# Patient Record
Sex: Male | Born: 1975 | Race: White | Hispanic: No | Marital: Single | Smoking: Current every day smoker
Health system: Southern US, Community
[De-identification: ages and names within clinical notes are randomized; demographics above are authoritative.]

## PROBLEM LIST (undated history)

## (undated) DIAGNOSIS — L03319 Cellulitis of trunk, unspecified: Secondary | ICD-10-CM

## (undated) DIAGNOSIS — L02219 Cutaneous abscess of trunk, unspecified: Secondary | ICD-10-CM

## (undated) DIAGNOSIS — Z8701 Personal history of pneumonia (recurrent): Secondary | ICD-10-CM

## (undated) HISTORY — DX: Personal history of pneumonia (recurrent): Z87.01

## (undated) HISTORY — DX: Cutaneous abscess of trunk, unspecified: L02.219

## (undated) HISTORY — PX: CYST EXCISION: SHX5701

## (undated) HISTORY — DX: Cellulitis of trunk, unspecified: L03.319

---

## 2005-09-11 ENCOUNTER — Emergency Department (HOSPITAL_COMMUNITY): Admission: EM | Admit: 2005-09-11 | Discharge: 2005-09-11 | Payer: Self-pay | Admitting: Family Medicine

## 2011-12-27 ENCOUNTER — Ambulatory Visit (INDEPENDENT_AMBULATORY_CARE_PROVIDER_SITE_OTHER): Payer: 59 | Admitting: Family Medicine

## 2011-12-27 VITALS — BP 129/80 | HR 72 | Temp 97.5°F | Resp 18 | Ht 66.5 in | Wt 196.2 lb

## 2011-12-27 DIAGNOSIS — L259 Unspecified contact dermatitis, unspecified cause: Secondary | ICD-10-CM

## 2011-12-27 MED ORDER — TRIAMCINOLONE ACETONIDE 0.1 % EX CREA: TOPICAL_CREAM | Freq: Two times a day (BID) | CUTANEOUS | Status: DC

## 2011-12-27 MED ORDER — METHYLPREDNISOLONE ACETATE 80 MG/ML IJ SUSP
80.0000 mg | Freq: Once | INTRAMUSCULAR | Status: AC
  Administered 2011-12-27: 80 mg via INTRAMUSCULAR

## 2011-12-27 NOTE — Patient Instructions (Signed)

## 2011-12-27 NOTE — Progress Notes (Signed)
Subjective: Patient was working outdoors or Friday. Since then he started breaking out with a rash in his upper arms, mostly above the elbows and below the sleeve line on the medial aspect of the arm. It is red and itchy. He has never been allergic to poison ivy that he knows of, for use to be able to pull it with his hands. No other known allergens. He works setting up dog shows.  Objective: Erythematous macular rash on his upper arms with some linear excoriations.  Assessment: Contact dermatitis  Plan: Solu-Medrol 80 a.m. Triamcinolone cream twice daily Return if worse.

## 2014-03-18 ENCOUNTER — Ambulatory Visit (INDEPENDENT_AMBULATORY_CARE_PROVIDER_SITE_OTHER): Payer: 59 | Admitting: Emergency Medicine

## 2014-03-18 VITALS — BP 122/82 | HR 75 | Temp 98.1°F | Resp 16 | Ht 66.25 in | Wt 199.8 lb

## 2014-03-18 DIAGNOSIS — L259 Unspecified contact dermatitis, unspecified cause: Secondary | ICD-10-CM

## 2014-03-18 MED ORDER — PREDNISONE 10 MG PO TABS
ORAL_TABLET | ORAL | Status: DC
Start: 2014-03-18 — End: 2014-07-05

## 2014-03-18 NOTE — Progress Notes (Signed)
Subjective:  This chart was scribed for Lesle Chris, MD by Carl Best, Medical Scribe. This patient was seen in Room 11 and the patient's care was started at 1:35 PM.   Patient ID: Joshua Leach, male    DOB: [birth year], 38 y.o.   MRN: [id number]  HPI HPI Comments: Joshua Leach is a 38 y.o. male who presents to the [facility] complaining of an itchy rash on his forearms and lower extremities bilaterally that started 3 days ago while he was trimming edges.  He states that he used rubbing alcohol, Claritin 10, and hand sanitizer with mild relief to his symptoms.  The patient denies having any allergies to medication.  History reviewed. No pertinent past medical history. History reviewed. No pertinent past surgical history. History reviewed. No pertinent family history. History   Social History  . Marital Status: Single    Spouse Name: N/A    Number of Children: N/A  . Years of Education: N/A   Occupational History  . Not on file.   Social History Main Topics  . Smoking status: Current Every Day Smoker -- 1.00 packs/day  . Smokeless tobacco: Not on file  . Alcohol Use: Yes     Comment: 1 case  . Drug Use: Not on file  . Sexual Activity: Not on file   Other Topics Concern  . Not on file   Social History Narrative  . No narrative on file   No Known Allergies   Review of Systems  Skin: Positive for rash. Negative for color change, pallor and wound.  All other systems reviewed and are negative.    Objective:  Physical Exam  Nursing note and vitals reviewed. Constitutional: He is oriented to person, place, and time. He appears well-developed and well-nourished.  HENT:  Head: Normocephalic and atraumatic.  Eyes: EOM are normal.  Neck: Normal range of motion.  Cardiovascular: Normal rate.   Pulmonary/Chest: Effort normal.  Musculoskeletal: Normal range of motion.  Neurological: He is alert and oriented to person, place, and time.    Skin: Skin is warm and dry. Rash noted.  Maculopapular rash involving both forearms and both lower extremities.    Psychiatric: He has a normal mood and affect. His behavior is normal.     BP 122/82  Pulse 75  Temp(Src) 98.1 F (36.7 C) (Oral)  Resp 16  Ht 5' 6.25" (1.683 m)  Wt 199 lb 12.8 oz (90.629 kg)  BMI 32.00 kg/m2  SpO2 99% Assessment & Plan:  Will treat with prednisone for 6 days. Claritin one a day Benadryl at night.  I personally performed the services described in this documentation, which was scribed in my presence. The recorded information has been reviewed and is accurate.

## 2014-03-18 NOTE — Patient Instructions (Signed)
       Poison Ivy Poison ivy is a inflammation of the skin (contact dermatitis) caused by touching the allergens on the leaves of the ivy plant following previous exposure to the plant. The rash usually appears 48 hours after exposure. The rash is usually bumps (papules) or blisters (vesicles) in a linear pattern. Depending on your own sensitivity, the rash may simply cause redness and itching, or it may also progress to blisters which may break open. These must be well cared for to prevent secondary bacterial (germ) infection, followed by scarring. Keep any open areas dry, clean, dressed, and covered with an antibacterial ointment if needed. The eyes may also get puffy. The puffiness is worst in the morning and gets better as the day progresses. This dermatitis usually heals without scarring, within 2 to 3 weeks without treatment. HOME CARE INSTRUCTIONS  Thoroughly wash with soap and water as soon as you have been exposed to poison ivy. You have about one half hour to remove the plant resin before it will cause the rash. This washing will destroy the oil or antigen on the skin that is causing, or will cause, the rash. Be sure to wash under your fingernails as any plant resin there will continue to spread the rash. Do not rub skin vigorously when washing affected area. Poison ivy cannot spread if no oil from the plant remains on your body. A rash that has progressed to weeping sores will not spread the rash unless you have not washed thoroughly. It is also important to wash any clothes you have been wearing as these may carry active allergens. The rash will return if you wear the unwashed clothing, even several days later. Avoidance of the plant in the future is the best measure. Poison ivy plant can be recognized by the number of leaves. Generally, poison ivy has three leaves with flowering branches on a single stem. Diphenhydramine may be purchased over the counter and used as needed for itching. Do not  drive with this medication if it makes you drowsy.Ask your caregiver about medication for children. SEEK MEDICAL CARE IF:  Open sores develop.  Redness spreads beyond area of rash.  You notice purulent (pus-like) discharge.  You have increased pain.  Other signs of infection develop (such as fever). Document Released: 08/13/2000 Document Revised: 11/08/2011 Document Reviewed: 07/02/2009 [employer] Patient Information 2015 [employer]. This information is not intended to replace advice given to you by your health care provider. Make sure you discuss any questions you have with your health care provider.  

## 2014-06-25 ENCOUNTER — Ambulatory Visit (INDEPENDENT_AMBULATORY_CARE_PROVIDER_SITE_OTHER): Payer: 59

## 2014-06-25 ENCOUNTER — Ambulatory Visit (INDEPENDENT_AMBULATORY_CARE_PROVIDER_SITE_OTHER): Payer: 59 | Admitting: Internal Medicine

## 2014-06-25 VITALS — BP 120/80 | HR 85 | Temp 97.9°F | Resp 18 | Ht 68.0 in | Wt 208.0 lb

## 2014-06-25 DIAGNOSIS — L259 Unspecified contact dermatitis, unspecified cause: Secondary | ICD-10-CM

## 2014-06-25 DIAGNOSIS — R062 Wheezing: Secondary | ICD-10-CM

## 2014-06-25 DIAGNOSIS — R05 Cough: Secondary | ICD-10-CM

## 2014-06-25 DIAGNOSIS — R0981 Nasal congestion: Secondary | ICD-10-CM

## 2014-06-25 DIAGNOSIS — R059 Cough, unspecified: Secondary | ICD-10-CM

## 2014-06-25 DIAGNOSIS — J159 Unspecified bacterial pneumonia: Secondary | ICD-10-CM

## 2014-06-25 LAB — POCT CBC
GRANULOCYTE PERCENT: 76.5 % (ref 37–80)
HEMATOCRIT: 50.4 % (ref 43.5–53.7)
HEMOGLOBIN: 16.7 g/dL (ref 14.1–18.1)
Lymph, poc: 2.7 (ref 0.6–3.4)
MCH, POC: 32.3 pg — AB (ref 27–31.2)
MCHC: 33 g/dL (ref 31.8–35.4)
MCV: 97.8 fL — AB (ref 80–97)
MID (cbc): 0.9 (ref 0–0.9)
MPV: 7.4 fL (ref 0–99.8)
POC GRANULOCYTE: 11.6 — AB (ref 2–6.9)
POC LYMPH PERCENT: 17.8 %L (ref 10–50)
POC MID %: 5.7 %M (ref 0–12)
Platelet Count, POC: 245 10*3/uL (ref 142–424)
RBC: 5.16 M/uL (ref 4.69–6.13)
RDW, POC: 13.2 %
WBC: 15.2 10*3/uL — AB (ref 4.6–10.2)

## 2014-06-25 MED ORDER — ALBUTEROL SULFATE HFA 108 (90 BASE) MCG/ACT IN AERS: 2.0000 | INHALATION_SPRAY | RESPIRATORY_TRACT | Status: DC | PRN

## 2014-06-25 MED ORDER — ALBUTEROL SULFATE (2.5 MG/3ML) 0.083% IN NEBU
2.5000 mg | INHALATION_SOLUTION | Freq: Once | RESPIRATORY_TRACT | Status: AC
  Administered 2014-06-25: 2.5 mg via RESPIRATORY_TRACT

## 2014-06-25 MED ORDER — METHYLPREDNISOLONE ACETATE 80 MG/ML IJ SUSP
80.0000 mg | Freq: Once | INTRAMUSCULAR | Status: AC
  Administered 2014-06-25: 80 mg via INTRAMUSCULAR

## 2014-06-25 MED ORDER — AZITHROMYCIN 250 MG PO TABS: ORAL_TABLET | ORAL | Status: DC

## 2014-06-25 MED ORDER — HYDROCOD POLST-CHLORPHEN POLST 10-8 MG/5ML PO LQCR: 5.0000 mL | Freq: Two times a day (BID) | ORAL | Status: DC | PRN

## 2014-06-25 MED ORDER — BENZONATATE 100 MG PO CAPS: 100.0000 mg | ORAL_CAPSULE | Freq: Three times a day (TID) | ORAL | Status: DC | PRN

## 2014-06-25 MED ORDER — IPRATROPIUM BROMIDE 0.02 % IN SOLN
0.5000 mg | Freq: Once | RESPIRATORY_TRACT | Status: AC
  Administered 2014-06-25: 0.5 mg via RESPIRATORY_TRACT

## 2014-06-25 MED ORDER — TRIAMCINOLONE ACETONIDE 0.1 % EX CREA: TOPICAL_CREAM | Freq: Two times a day (BID) | CUTANEOUS | Status: AC

## 2014-06-25 NOTE — Progress Notes (Signed)
Subjective:    Patient ID: Joshua Leach, male    DOB: 09/12/1975, 38 y.o.   MRN: [id number]  Cough Associated symptoms include chills, a fever, headaches and a rash. Pertinent negatives include no sore throat or shortness of breath.  Rash Associated symptoms include congestion, coughing, diarrhea (1 day last week that resolved after 1 episode), fatigue and a fever. Pertinent negatives include no shortness of breath or sore throat.    38 year old male 1pk/day smoker with allergies to dog dander, here today with chief complaint of cough, rash, and nasal congestion.  He states this began 1 month ago with coughing that progressively worsened to intractable coughing into dry heaving.  The productive phlegm started as green, but has now turned into a white thick mucus.  This coughing was associated with a headache, congestion, and ear fullness.  Their is no ear pain, but sounds like a bad speaker with some vibratory sounds to their speech.  He takes naproxen for the headache which helps.  But he has tried Nyquil, Dayquil, and Tussin DM to no avail.  He had associated chills and waking up in a cold sweat.  [facility], due to his busy travelling work week.  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  He also has 4 dogs at home, despite his allergies.  He states the his allergies are much better when he is staying in the hotels.    Sleep: Beer 6 cans/night to sleep.  He sleeps about 4 hrs each night.  Has coffee in the morning.  Has racing thoughts in the evening.  Smoking 1pk/day if not busy.  No illicit drug use.    Rash: Itchy rash for 1 month at his waist line.  It is pruritic.  He notices that it goes away when he does not use his belt after a while and rash will scale.      Review of Systems  Constitutional: Positive for fever, chills, diaphoresis and fatigue.  HENT: Positive for congestion and hearing loss. Negative for facial swelling, sinus pressure, sneezing and  sore throat.   Respiratory: Positive for cough. Negative for apnea, chest tightness and shortness of breath.   Gastrointestinal: Positive for diarrhea (1 day last week that resolved after 1 episode). Negative for constipation.  Skin: Positive for rash.  Neurological: Positive for dizziness (secondary to intractable coughing) and headaches.       Objective:   Physical Exam  Constitutional: He is oriented to person, place, and time. He appears well-developed and well-nourished. No distress.  BP 120/80  Pulse 85  Temp(Src) 97.9 F (36.6 C) (Oral)  Resp 18  Ht 5\' 8"  (1.727 m)  Wt 208 lb (94.348 kg)  BMI 31.63 kg/m2  SpO2 98%  HENT:  Right Ear: No tenderness. Tympanic membrane is erythematous (Mild erythema around TM.).  Left Ear: No tenderness. Tympanic membrane is not erythematous.  Nose: Mucosal edema and rhinorrhea (Mild rhinorrhea) present. Right sinus exhibits no maxillary sinus tenderness and no frontal sinus tenderness. Left sinus exhibits no maxillary sinus tenderness and no frontal sinus tenderness.  Mouth/Throat: Oropharynx is clear and moist. Mucous membranes are not pale and not cyanotic. No posterior oropharyngeal edema, posterior oropharyngeal erythema or tonsillar abscesses.  Eyes: Right eye exhibits no discharge. Left eye exhibits no discharge. Right pupil is round and reactive. Left pupil is round. Pupils are equal.  Cardiovascular: Normal rate, regular rhythm and normal heart sounds.   Pulmonary/Chest: Effort  normal. No apnea, not tachypneic and not bradypneic. No respiratory distress. He has no decreased breath sounds. He has wheezes (Left lower lobe is has wheezing after the end of forced expiratory. ) in the left middle field and the left lower field.  Lymphadenopathy:    He has cervical adenopathy (Left anterior cervical adenopathy).  Neurological: He is alert and oriented to person, place, and time.  Skin: Rash (3 inches from umbilicus is erythematous patch slightly  rounded with large scaling) noted.  Psychiatric: He has a normal mood and affect. His speech is normal and behavior is normal. Judgment and thought content normal.    Results for orders placed in visit on 06/25/14  POCT CBC      Result Value Ref Range   WBC 15.2 (*) 4.6 - 10.2 K/uL   Lymph, poc 2.7  0.6 - 3.4   POC LYMPH PERCENT 17.8  10 - 50 %L   MID (cbc) 0.9  0 - 0.9   POC MID % 5.7  0 - 12 %M   POC Granulocyte 11.6 (*) 2 - 6.9   Granulocyte percent 76.5  37 - 80 %G   RBC 5.16  4.69 - 6.13 M/uL   Hemoglobin 16.7  14.1 - 18.1 g/dL   HCT, POC 78.250.4  95.643.5 - 53.7 %   MCV 97.8 (*) 80 - 97 fL   MCH, POC 32.3 (*) 27 - 31.2 pg   MCHC 33.0  31.8 - 35.4 g/dL   RDW, POC 21.313.2     Platelet Count, POC 245  142 - 424 K/uL   MPV 7.4  0 - 99.8 fL   Dr. Harriette OharaMcCaulin Peak Flow Prediction 634 (per UptoDate Calculator.) Pt. Peak Flow pre-nebulizing tx.590 Pt. Peak Flow post-nebulizing tx 527  [facility] reading (PRIMARY) by  Dr. Perrin MalteseGuest: Right lobe infiltrates.     Assessment & Plan:  Pneumonia, bacterial  Infiltrates, increase in WBC, paired with physical exam show a respiratory illness with a most likely cause of pneumonia.  He is dealing with both irritant and allergic materials by his tobacco use and living with his dogs.  He has hx of upper respiratory infection, most likely from inflammation of these contacts that cause him to not have the ability to clear out substances like bacterial and viral inoculation.  He does not notice the wheezing, however it is present, and as a smoker who does not avoid anything that may disrupt respiratory passage and clearance, I am doubtful that he would know if he was dyspneic unless severe.    -CBC, albuterol (PROVENTIL) (2.5 MG/3ML) 0.083% nebulizer solution 2.5 mg, ipratropium (ATROVENT) nebulizer solution 0.5 mg--The nebulizing treatment loosened up the mucus and the wheezing became more prominent and identified.     -methylPREDNISolone acetate (DEPO-MEDROL)  injection 80 mg  -Encouraged patient's smoking cessation both verbally and with paper handout.    -benzonatate (TESSALON) 100 MG capsule 1-2 pearls TID  - chlorpheniramine-HYDROcodone (TUSSIONEX PENNKINETIC ER) 10-8 MG/5ML LQCR  -albuterol (PROVENTIL HFA;VENTOLIN HFA) 108 (90 BASE) MCG/ACT inhaler-PRN with wheezing.   -RTC in 4 days to see if lungs are clearer.      -Contact PCP doctor, Dr. Harriette [facility]McCaulin or [facility] for smoking cessation facilitation and sleep problems.    Contact dermatitis  triamcinolone cream (KENALOG) 0.1 % -Discussed avoiding metals to skin ie nickel  Trena PlattStephanie English, PA-C [facility] 10/27/201512:49 PM

## 2014-06-25 NOTE — Patient Instructions (Addendum)
Please take the azithromycin for 5 days. Please return to the clinic on Saturday for a check up. Tussionex for nighttime coughing. Take tylenol (acetaminophen) if fever starts. Please try the zyrtec or claritin for allergic reactions. Tessalon pearls 1-2 pearls, three times per day for cough.      Smoking Cessation Quitting smoking is important to your health and has many advantages. However, it is not always easy to quit since nicotine is a very addictive drug. Oftentimes, people try 3 times or more before being able to quit. This document explains the best ways for you to prepare to quit smoking. Quitting takes hard work and a lot of effort, but you can do it. ADVANTAGES OF QUITTING SMOKING  You will live longer, feel better, and live better.  Your body will feel the impact of quitting smoking almost immediately.  Within 20 minutes, blood pressure decreases. Your pulse returns to its normal level.  After 8 hours, carbon monoxide levels in the blood return to normal. Your oxygen level increases.  After 24 hours, the chance of having a heart attack starts to decrease. Your breath, hair, and body stop smelling like smoke.  After 48 hours, damaged nerve endings begin to recover. Your sense of taste and smell improve.  After 72 hours, the body is virtually free of nicotine. Your bronchial tubes relax and breathing becomes easier.  After 2 to 12 weeks, lungs can hold more air. Exercise becomes easier and circulation improves.  The risk of having a heart attack, stroke, cancer, or lung disease is greatly reduced.  After 1 year, the risk of coronary heart disease is cut in half.  After 5 years, the risk of stroke falls to the same as a nonsmoker.  After 10 years, the risk of lung cancer is cut in half and the risk of other cancers decreases significantly.  After 15 years, the risk of coronary heart disease drops, usually to the level of a nonsmoker.  If you are pregnant, quitting  smoking will improve your chances of having a healthy baby.  The people you live with, especially any children, will be healthier.  You will have extra money to spend on things other than cigarettes. QUESTIONS TO THINK ABOUT BEFORE ATTEMPTING TO QUIT You may want to talk about your answers with your health care provider.  Why do you want to quit?  If you tried to quit in the past, what helped and what did not?  What will be the most difficult situations for you after you quit? How will you plan to handle them?  Who can help you through the tough times? Your family? Friends? A health care provider?  What pleasures do you get from smoking? What ways can you still get pleasure if you quit? Here are some questions to ask your health care provider:  How can you help me to be successful at quitting?  What medicine do you think would be best for me and how should I take it?  What should I do if I need more help?  What is smoking withdrawal like? How can I get information on withdrawal? GET READY  Set a quit date.  Change your environment by getting rid of all cigarettes, ashtrays, matches, and lighters in your home, car, or work. Do not let people smoke in your home.  Review your past attempts to quit. Think about what worked and what did not. GET SUPPORT AND ENCOURAGEMENT You have a better chance of being successful if you  have help. You can get support in many ways.  Tell your family, friends, and coworkers that you are going to quit and need their support. Ask them not to smoke around you.  Get individual, group, or telephone counseling and support. Programs are available at [employer] and health centers. Call your local health department for information about programs in your area.  Spiritual beliefs and practices may help some smokers quit.  Download a "quit meter" on your computer to keep track of quit statistics, such as how long you have gone without smoking,  cigarettes not smoked, and money saved.  Get a self-help book about quitting smoking and staying off tobacco. Pump Back yourself from urges to smoke. Talk to someone, go for a walk, or occupy your time with a task.  Change your normal routine. Take a different route to work. Drink tea instead of coffee. Eat breakfast in a different place.  Reduce your stress. Take a hot bath, exercise, or read a book.  Plan something enjoyable to do every day. Reward yourself for not smoking.  Explore interactive web-based programs that specialize in helping you quit. GET MEDICINE AND USE IT CORRECTLY Medicines can help you stop smoking and decrease the urge to smoke. Combining medicine with the above behavioral methods and support can greatly increase your chances of successfully quitting smoking.  Nicotine replacement therapy helps deliver nicotine to your body without the negative effects and risks of smoking. Nicotine replacement therapy includes nicotine gum, lozenges, inhalers, nasal sprays, and skin patches. Some may be available over-the-counter and others require a prescription.  Antidepressant medicine helps people abstain from smoking, but how this works is unknown. This medicine is available by prescription.  Nicotinic receptor partial agonist medicine simulates the effect of nicotine in your brain. This medicine is available by prescription. Ask your health care provider for advice about which medicines to use and how to use them based on your health history. Your health care provider will tell you what side effects to look out for if you choose to be on a medicine or therapy. Carefully read the information on the package. Do not use any other product containing nicotine while using a nicotine replacement product.  RELAPSE OR DIFFICULT SITUATIONS Most relapses occur within the first 3 months after quitting. Do not be discouraged if you start smoking again. Remember,  most people try several times before finally quitting. You may have symptoms of withdrawal because your body is used to nicotine. You may crave cigarettes, be irritable, feel very hungry, cough often, get headaches, or have difficulty concentrating. The withdrawal symptoms are only temporary. They are strongest when you first quit, but they will go away within 10-14 days. To reduce the chances of relapse, try to:  Avoid drinking alcohol. Drinking lowers your chances of successfully quitting.  Reduce the amount of caffeine you consume. Once you quit smoking, the amount of caffeine in your body increases and can give you symptoms, such as a rapid heartbeat, sweating, and anxiety.  Avoid smokers because they can make you want to smoke.  Do not let weight gain distract you. Many smokers will gain weight when they quit, usually less than 10 pounds. Eat a healthy diet and stay active. You can always lose the weight gained after you quit.  Find ways to improve your mood other than smoking. FOR MORE INFORMATION  www.smokefree.gov  Document Released: 08/10/2001 Document Revised: 12/31/2013 Document Reviewed: 11/25/2011 [employer] Patient Information 2015 [employer]. This information is not intended to replace advice given to you by your health care provider. Make sure you discuss any questions you have with your health care provider.  

## 2014-07-05 ENCOUNTER — Ambulatory Visit (INDEPENDENT_AMBULATORY_CARE_PROVIDER_SITE_OTHER): Payer: 59 | Admitting: Family Medicine

## 2014-07-05 ENCOUNTER — Encounter: Payer: Self-pay | Admitting: Physician Assistant

## 2014-07-05 ENCOUNTER — Ambulatory Visit (INDEPENDENT_AMBULATORY_CARE_PROVIDER_SITE_OTHER): Payer: 59

## 2014-07-05 VITALS — BP 130/88 | HR 106 | Temp 98.6°F | Resp 18 | Ht 67.0 in | Wt 203.8 lb

## 2014-07-05 DIAGNOSIS — R059 Cough, unspecified: Secondary | ICD-10-CM

## 2014-07-05 DIAGNOSIS — Z720 Tobacco use: Secondary | ICD-10-CM | POA: Insufficient documentation

## 2014-07-05 DIAGNOSIS — J189 Pneumonia, unspecified organism: Secondary | ICD-10-CM

## 2014-07-05 DIAGNOSIS — R05 Cough: Secondary | ICD-10-CM

## 2014-07-05 LAB — POCT CBC
Granulocyte percent: 80.8 %G — AB (ref 37–80)
HEMATOCRIT: 46.2 % (ref 43.5–53.7)
HEMOGLOBIN: 15.6 g/dL (ref 14.1–18.1)
LYMPH, POC: 1.6 (ref 0.6–3.4)
MCH, POC: 32.3 pg — AB (ref 27–31.2)
MCHC: 33.7 g/dL (ref 31.8–35.4)
MCV: 95.8 fL (ref 80–97)
MID (cbc): 0.4 (ref 0–0.9)
MPV: 7.4 fL (ref 0–99.8)
POC GRANULOCYTE: 8.5 — AB (ref 2–6.9)
POC LYMPH PERCENT: 15.4 %L (ref 10–50)
POC MID %: 3.8 %M (ref 0–12)
Platelet Count, POC: 242 10*3/uL (ref 142–424)
RBC: 4.83 M/uL (ref 4.69–6.13)
RDW, POC: 13.6 %
WBC: 10.5 10*3/uL — AB (ref 4.6–10.2)

## 2014-07-05 MED ORDER — LEVOFLOXACIN 500 MG PO TABS: 500.0000 mg | ORAL_TABLET | Freq: Every day | ORAL | Status: DC

## 2014-07-05 MED ORDER — HYDROCOD POLST-CHLORPHEN POLST 10-8 MG/5ML PO LQCR: 5.0000 mL | Freq: Two times a day (BID) | ORAL | Status: DC | PRN

## 2014-07-05 NOTE — Progress Notes (Signed)
Subjective:    Patient ID: Joshua Leach, male    DOB: [birth year], 38 y.o.   MRN: [id number] Patient Active Problem List   Diagnosis Date Noted  . Tobacco abuse 07/05/2014   Prior to Admission medications   Medication Sig Start Date End Date Taking? Authorizing Provider  albuterol (PROVENTIL HFA;VENTOLIN HFA) 108 (90 BASE) MCG/ACT inhaler Inhale 2 puffs into the lungs every 4 (four) hours as needed for wheezing or shortness of breath (cough, shortness of breath or wheezing.). 06/25/14  Yes Stephanie D English, PA  benzonatate (TESSALON) 100 MG capsule Take 1-2 capsules (100-200 mg total) by mouth 3 (three) times daily as needed for cough. 06/25/14  Yes Collie SiadStephanie D English, PA  triamcinolone cream (KENALOG) 0.1 % Apply topically 2 (two) times daily. 06/25/14 06/25/15 Yes Stephanie D English, PA  azithromycin (ZITHROMAX) 250 MG tablet Take 2 tabs PO x 1 dose, then 1 tab PO QD x 4 days 06/25/14   Collie SiadStephanie D English, PA  chlorpheniramine-HYDROcodone (TUSSIONEX PENNKINETIC ER) 10-8 MG/5ML LQCR Take 5 mLs by mouth every 12 (twelve) hours as needed for cough (cough). 06/25/14   Collie SiadStephanie D English, PA  predniSONE (DELTASONE) 10 MG tablet Takes 6 a day for one day 5 a day for one day 4 a day for one day 3 a day for one day 2  a day for one day 1 a day for one day 03/18/14   Collene GobbleSteven A Daub, MD   No Known Allergies  HPI  This is a 38 year old male with PMH tobacco and alcohol abuse presenting for follow up of pneumonia diagnosed on 06/25/14. He was treated with zithromax. He reports after finishing the antibiotic he had four days of improved symptoms. Over the past 3 days he reports he has developed another productive cough of white sputum. He is also experiencing nasal congestion and maxillary sinus pressure. He denies fever, chills, sore throat, otalgia, or wheezing. Last visit he was prescribed albuterol which he is using twice a day for SOB. He was also prescribed tessalon perles and tussionex  both of which worked well for him. He has tessalon perles still at home but is out of tussionex. He is a current every day smoker of 0.5 -1 ppd, which he reports is decreased from 1.5 ppd. He does not have a cough as baseline. He drinks 6 beers a night to sleep.  Review of Systems  Constitutional: Negative for fever and chills.  HENT: Positive for congestion and sinus pressure. Negative for ear pain and sore throat.   Eyes: Negative.   Respiratory: Positive for cough and shortness of breath. Negative for wheezing.   Gastrointestinal: Negative for nausea, vomiting and diarrhea.  Skin: Negative for rash.      Objective:   Physical Exam  Constitutional: He is oriented to person, place, and time. He appears well-developed and well-nourished. No distress.  HENT:  Head: Normocephalic and atraumatic.  Right Ear: Hearing, tympanic membrane, external ear and ear canal normal.  Left Ear: Hearing, tympanic membrane, external ear and ear canal normal.  Nose: Mucosal edema present. Right sinus exhibits no maxillary sinus tenderness and no frontal sinus tenderness. Left sinus exhibits no maxillary sinus tenderness and no frontal sinus tenderness.  Mouth/Throat: Uvula is midline, oropharynx is clear and moist and mucous membranes are normal. No oropharyngeal exudate, posterior oropharyngeal edema or posterior oropharyngeal erythema.  Eyes: Conjunctivae, EOM and lids are normal. Right eye exhibits no discharge. Left eye exhibits no discharge. No scleral  icterus.  Cardiovascular: Normal rate, regular rhythm, normal heart sounds and normal pulses.   No murmur heard. Pulmonary/Chest: Effort normal and breath sounds normal. No respiratory distress. He has no wheezes. He has no rhonchi. He has no rales.  Lymphadenopathy:       Head (right side): No submental, no submandibular, no tonsillar and no occipital adenopathy present.       Head (left side): No submental, no submandibular, no tonsillar and no occipital  adenopathy present.    He has cervical adenopathy.  Neurological: He is alert and oriented to person, place, and time.  Skin: Skin is warm, dry and intact. No lesion and no rash noted.  Psychiatric: He has a normal mood and affect. His speech is normal and behavior is normal. Thought content normal.   [facility] reading (PRIMARY) by  Dr. Neva SeatGreene: Interval improvement right lower lobe infiltrate.  Results for orders placed or performed in visit on 07/05/14  POCT CBC  Result Value Ref Range   WBC 10.5 (A) 4.6 - 10.2 K/uL   Lymph, poc 1.6 0.6 - 3.4   POC LYMPH PERCENT 15.4 10 - 50 %L   MID (cbc) 0.4 0 - 0.9   POC MID % 3.8 0 - 12 %M   POC Granulocyte 8.5 (A) 2 - 6.9   Granulocyte percent 80.8 (A) 37 - 80 %G   RBC 4.83 4.69 - 6.13 M/uL   Hemoglobin 15.6 14.1 - 18.1 g/dL   HCT, POC 16.146.2 09.643.5 - 53.7 %   MCV 95.8 80 - 97 fL   MCH, POC 32.3 (A) 27 - 31.2 pg   MCHC 33.7 31.8 - 35.4 g/dL   RDW, POC 04.513.6 %   Platelet Count, POC 242 142 - 424 K/uL   MPV 7.4 0 - 99.8 fL      Assessment & Plan:  1. Cough 2. Pneumonia, organism unspecified 3. Tobacco abuse Patient likely has incompletely treated right lower lobe pneumonia. His chest radiograph is improved but not completely. His wbc count is reduced from 15.2 to 10.5 - improved but not returned to normal range. Will treat with 7 days of levaquin to cover common respiratory pathogens as well as klebsiella. He will continue with tessalon perles, tussionex and albuterol for symptom control. He was counseled on tobacco cessation. Will return in 7-10 days if symptoms worsen or fail to improve.  - DG Chest 2 View; Future - POCT CBC - levofloxacin (LEVAQUIN) 500 MG tablet; Take 1 tablet (500 mg total) by mouth daily.  Dispense: 7 tablet; Refill: 0 - chlorpheniramine-HYDROcodone (TUSSIONEX PENNKINETIC ER) 10-8 MG/5ML LQCR; Take 5 mLs by mouth every 12 (twelve) hours as needed for cough (cough).  Dispense: 70 mL; Refill: 0   Nicole V. Dyke BrackettBush, PA-C,  [facility]  07/05/2014

## 2014-07-05 NOTE — Patient Instructions (Signed)
If after 7-10 days your symptoms are not improving, return to clinic. Keep cutting down on smoking! You can do it!

## 2014-07-09 NOTE — Progress Notes (Signed)
Xray read and patient discussed with Ms. Bush. Agree with assessment and plan of care per her note.   

## 2014-11-04 ENCOUNTER — Ambulatory Visit (INDEPENDENT_AMBULATORY_CARE_PROVIDER_SITE_OTHER): Payer: 59 | Admitting: Emergency Medicine

## 2014-11-04 VITALS — BP 126/84 | HR 78 | Temp 97.4°F | Resp 16 | Ht 67.0 in | Wt 201.8 lb

## 2014-11-04 DIAGNOSIS — J40 Bronchitis, not specified as acute or chronic: Secondary | ICD-10-CM | POA: Diagnosis not present

## 2014-11-04 DIAGNOSIS — M436 Torticollis: Secondary | ICD-10-CM | POA: Diagnosis not present

## 2014-11-04 DIAGNOSIS — J209 Acute bronchitis, unspecified: Secondary | ICD-10-CM

## 2014-11-04 MED ORDER — ALBUTEROL SULFATE HFA 108 (90 BASE) MCG/ACT IN AERS: 2.0000 | INHALATION_SPRAY | RESPIRATORY_TRACT | Status: DC | PRN

## 2014-11-04 MED ORDER — HYDROCOD POLST-CHLORPHEN POLST 10-8 MG/5ML PO LQCR: 5.0000 mL | Freq: Two times a day (BID) | ORAL | Status: DC | PRN

## 2014-11-04 MED ORDER — CYCLOBENZAPRINE HCL 10 MG PO TABS: 10.0000 mg | ORAL_TABLET | Freq: Three times a day (TID) | ORAL | Status: DC | PRN

## 2014-11-04 MED ORDER — CLARITHROMYCIN 500 MG PO TABS: 500.0000 mg | ORAL_TABLET | Freq: Two times a day (BID) | ORAL | Status: DC

## 2014-11-04 MED ORDER — NAPROXEN SODIUM 550 MG PO TABS: 550.0000 mg | ORAL_TABLET | Freq: Two times a day (BID) | ORAL | Status: DC

## 2014-11-04 NOTE — Patient Instructions (Signed)
Metered Dose Inhaler (No Spacer Used) Inhaled medicines are the basis of treatment for asthma and other breathing problems. Inhaled medicine can only be effective if used properly. Good technique assures that the medicine reaches the lungs. Metered dose inhalers (MDIs) are used to deliver a variety of inhaled medicines. These include quick relief or rescue medicines (such as bronchodilators) and controller medicines (such as corticosteroids). The medicine is delivered by pushing down on a metal canister to release a set amount of spray. If you are using different kinds of inhalers, use your quick relief medicine to open the airways 10-15 minutes before using a steroid, if instructed to do so by your health care provider. If you are unsure which inhalers to use and the order of using them, ask your health care provider, nurse, or respiratory therapist. HOW TO USE THE INHALER 1. Remove the cap from the inhaler. 2. If you are using the inhaler for the first time, you will need to prime it. Shake the inhaler for 5 seconds and release four puffs into the air, away from your face. Ask your health care provider or pharmacist if you have questions about priming your inhaler. 3. Shake the inhaler for 5 seconds before each breath in (inhalation). 4. Position the inhaler so that the top of the canister faces up. 5. Put your index finger on the top of the medicine canister. Your thumb supports the bottom of the inhaler. 6. Open your mouth. 7. Either place the inhaler between your teeth and place your lips tightly around the mouthpiece, or hold the inhaler 1-2 inches away from your open mouth. If you are unsure of which technique to use, ask your health care provider. 8. Breathe out (exhale) normally and as completely as possible. 9. Press the canister down with the index finger to release the medicine. 10. At the same time as the canister is pressed, inhale deeply and slowly until your lungs are completely filled.  This should take 4-6 seconds. Keep your tongue down. 11. Hold the medicine in your lungs for 5-10 seconds (10 seconds is best). This helps the medicine get into the small airways of your lungs. 12. Breathe out slowly, through pursed lips. Whistling is an example of pursed lips. 13. Wait at least 1 minute between puffs. Continue with the above steps until you have taken the number of puffs your health care provider has ordered. Do not use the inhaler more than your health care provider directs you to. 14. Replace the cap on the inhaler. 15. Follow the directions from your health care provider or the inhaler insert for cleaning the inhaler. If you are using a steroid inhaler, after your last puff, rinse your mouth with water, gargle, and spit out the water. Do not swallow the water. AVOID:  Inhaling before or after starting the spray of medicine. It takes practice to coordinate your breathing with triggering the spray.  Inhaling through the nose (rather than the mouth) when triggering the spray. HOW TO DETERMINE IF YOUR INHALER IS FULL OR NEARLY EMPTY You cannot know when an inhaler is empty by shaking it. Some inhalers are now being made with dose counters. Ask your health care provider for a prescription that has a dose counter if you feel you need that extra help. If your inhaler does not have a counter, ask your health care provider to help you determine the date you need to refill your inhaler. Write the refill date on a calendar or your inhaler canister. Refill   your inhaler 7-10 days before it runs out. Be sure to keep an adequate supply of medicine. This includes making sure it has not expired, and making sure you have a spare inhaler. SEEK MEDICAL CARE IF:  Symptoms are only partially relieved with your inhaler.  You are having trouble using your inhaler.  You experience an increase in phlegm. SEEK IMMEDIATE MEDICAL CARE IF:  You feel little or no relief with your inhalers. You are still  wheezing and feeling shortness of breath, tightness in your chest, or both.  You have dizziness, headaches, or a fast heart rate.  You have chills, fever, or night sweats.  There is a noticeable increase in phlegm production, or there is blood in the phlegm. MAKE SURE YOU:  Understand these instructions.  Will watch your condition.  Will get help right away if you are not doing well or get worse. Document Released: 06/13/2007 Document Revised: 12/31/2013 Document Reviewed: 02/01/2013 [facility] Patient Information 2015 [place], [place]. This information is not intended to replace advice given to you by your health care provider. Make sure you discuss any questions you have with your health care provider. Torticollis, Acute You have suddenly (acutely) developed a twisted neck (torticollis). This is usually a self-limited condition. CAUSES  Acute torticollis may be caused by malposition, trauma or infection. Most commonly, acute torticollis is caused by sleeping in an awkward position. Torticollis may also be caused by the flexion, extension or twisting of the neck muscles beyond their normal position. Sometimes, the exact cause may not be known. SYMPTOMS  Usually, there is pain and limited movement of the neck. Your neck may twist to one side. DIAGNOSIS  The diagnosis is often made by physical examination. X-rays, CT scans or MRIs may be done if there is a history of trauma or concern of infection. TREATMENT  For a common, stiff neck that develops during sleep, treatment is focused on relaxing the contracted neck muscle. Medications (including shots) may be used to treat the problem. Most cases resolve in several days. Torticollis usually responds to conservative physical therapy. If left untreated, the shortened and spastic neck muscle can cause deformities in the face and neck. Rarely, surgery is required. HOME CARE INSTRUCTIONS  16. Use over-the-counter and prescription medications as  directed by your caregiver. 17. Do stretching exercises and massage the neck as directed by your caregiver. 18. Follow up with physical therapy if needed and as directed by your caregiver. SEEK IMMEDIATE MEDICAL CARE IF:   You develop difficulty breathing or noisy breathing (stridor).  You drool, develop trouble swallowing or have pain with swallowing.  You develop numbness or weakness in the hands or feet.  You have changes in speech or vision.  You have problems with urination or bowel movements.  You have difficulty walking.  You have a fever.  You have increased pain. MAKE SURE YOU:   Understand these instructions.  Will watch your condition.  Will get help right away if you are not doing well or get worse. Document Released: 08/13/2000 Document Revised: 11/08/2011 Document Reviewed: 09/24/2009 [facility] Patient Information 2015 [place], [place]. This information is not intended to replace advice given to you by your health care provider. Make sure you discuss any questions you have with your health care provider.

## 2014-11-04 NOTE — Progress Notes (Signed)
[facility] [address], [place] [id number] [phone]  Date:  11/04/2014   Name:  Joshua SouChristopher S Youkhana   DOB:  04/07/1976   MRN:  [id number]  PCP:  No PCP Per Patient    Chief Complaint: Neck Pain and Cough   History of Present Illness:  Joshua SouChristopher S Detwiler is a 39 y.o. very pleasant male patient who presents with the following:  Patient say she had a tooth extracted on Wednesday.  On arising Thursday found his neck hurt  Has no radiation of pain and no neuro symptoms or weakness. Inclines head to left and cannot easily straighten without pain. No headache. No prior neck injury. Treated for pneumonia in November and now has a recurrent cough productive of a purulent sputum No fever or chills.  No wheezing or shortness of breath. No coryza No improvement with over the counter medications or other home remedies.  Denies other complaint or health concern today.   Patient Active Problem List   Diagnosis Date Noted  . Tobacco abuse 07/05/2014    No past medical history on file.  No past surgical history on file.  History  Substance Use Topics  . Smoking status: Current Every Day Smoker -- 1.00 packs/day  . Smokeless tobacco: Not on file  . Alcohol Use: Yes     Comment: 1 case    No family history on file.  No Known Allergies  Medication list has been reviewed and updated.  Current Outpatient Prescriptions on File Prior to Visit  Medication Sig Dispense Refill  . albuterol (PROVENTIL HFA;VENTOLIN HFA) 108 (90 BASE) MCG/ACT inhaler Inhale 2 puffs into the lungs every 4 (four) hours as needed for wheezing or shortness of breath (cough, shortness of breath or wheezing.). 1 Inhaler 1  . chlorpheniramine-HYDROcodone (TUSSIONEX PENNKINETIC ER) 10-8 MG/5ML LQCR Take 5 mLs by mouth every 12 (twelve) hours as needed for cough (cough). (Patient not taking: Reported on 11/04/2014) 70 mL 0  . triamcinolone cream (KENALOG) 0.1 % Apply topically 2 (two) times  daily. (Patient not taking: Reported on 11/04/2014) 45 g 0   No current facility-administered medications on file prior to visit.    Review of Systems:  As per HPI, otherwise negative.    Physical Examination: Filed Vitals:   11/04/14 0857  BP: 126/84  Pulse: 78  Temp: 97.4 F (36.3 C)  Resp: 16   Filed Vitals:   11/04/14 0857  Height: 5\' 7"  (1.702 m)  Weight: 201 lb 12.8 oz (91.536 kg)   Body mass index is 31.6 kg/(m^2). Ideal Body Weight: Weight in (lb) to have BMI = 25: 159.3  GEN: WDWN, NAD, Non-toxic, A & O x 3  Head inclined to left HEENT: Atraumatic, Normocephalic. Neck supple. No masses, No LAD. Ears and Nose: No external deformity. CV: RRR, No M/G/R. No JVD. No thrill. No extra heart sounds. PULM: CTA B, scattered wheezes, no crackles, rhonchi. No retractions. No resp. distress. No accessory muscle use. ABD: S, NT, ND, +BS. No rebound. No HSM. EXTR: No c/c/e NEURO Normal gait.  PSYCH: Normally interactive. Conversant. Not depressed or anxious appearing.  Calm demeanor.  Neck:  Tender right paraspinous muscles with spasm  Assessment and Plan: Bronchitis Torticollis Anaprox Flexeril tussionex biaxin  Signed,  Phillips OdorJeffery Diangelo Radel, MD

## 2015-05-12 ENCOUNTER — Ambulatory Visit (INDEPENDENT_AMBULATORY_CARE_PROVIDER_SITE_OTHER): Payer: 59 | Admitting: Family Medicine

## 2015-05-12 ENCOUNTER — Ambulatory Visit (INDEPENDENT_AMBULATORY_CARE_PROVIDER_SITE_OTHER): Payer: 59

## 2015-05-12 VITALS — BP 142/84 | HR 82 | Temp 98.0°F | Resp 18 | Ht 68.0 in | Wt 216.0 lb

## 2015-05-12 DIAGNOSIS — M25511 Pain in right shoulder: Secondary | ICD-10-CM

## 2015-05-12 MED ORDER — MELOXICAM 15 MG PO TABS: 15.0000 mg | ORAL_TABLET | Freq: Every day | ORAL | Status: DC

## 2015-05-12 NOTE — Progress Notes (Signed)
[facility] [address] [place] [address] [phone]  Date:  05/12/2015   Name:  Joshua Leach   DOB:  05-16-1976   MRN:  [id number]  PCP:  No PCP Per Patient    History of Present Illness:  Joshua Leach is a 39 y.o. male patient who presents to [facility] for chief complaint right shoulder pain that has been occurring for 3 months. There was no trauma and it has been a pain that is intermittent and not worsening.  There is some numbness and tingling to his extremities. There is pain at night that can sometimes wake him. He has tried Tylenol to no avail. Biofreeze may help for about 20 minutes. Though there is no trauma he will lift as much as 100 pounds carrying objects on his right shoulder with work. There is been a prominence to his right shoulder.     Patient Active Problem List   Diagnosis Date Noted  . Tobacco abuse 07/05/2014    History reviewed. No pertinent past medical history.  History reviewed. No pertinent past surgical history.  Social History  Substance Use Topics  . Smoking status: Current Every Day Smoker -- 1.00 packs/day  . Smokeless tobacco: None  . Alcohol Use: Yes     Comment: 1 case    History reviewed. No pertinent family history.  No Known Allergies  Medication list has been reviewed and updated.  Current Outpatient Prescriptions on File Prior to Visit  Medication Sig Dispense Refill  . albuterol (PROVENTIL HFA;VENTOLIN HFA) 108 (90 BASE) MCG/ACT inhaler Inhale 2 puffs into the lungs every 4 (four) hours as needed for wheezing or shortness of breath (cough, shortness of breath or wheezing.). (Patient not taking: Reported on 05/12/2015) 1 Inhaler 1  . albuterol (PROVENTIL HFA;VENTOLIN HFA) 108 (90 BASE) MCG/ACT inhaler Inhale 2 puffs into the lungs every 4 (four) hours as needed for wheezing or shortness of breath (cough, shortness of breath or wheezing.). (Patient not taking: Reported on 05/12/2015) 1 Inhaler 12  .  chlorpheniramine-HYDROcodone (TUSSIONEX PENNKINETIC ER) 10-8 MG/5ML LQCR Take 5 mLs by mouth every 12 (twelve) hours as needed for cough (cough). (Patient not taking: Reported on 11/04/2014) 70 mL 0  . chlorpheniramine-HYDROcodone (TUSSIONEX PENNKINETIC ER) 10-8 MG/5ML LQCR Take 5 mLs by mouth every 12 (twelve) hours as needed. (Patient not taking: Reported on 05/12/2015) 60 mL 0  . clarithromycin (BIAXIN) 500 MG tablet Take 1 tablet (500 mg total) by mouth 2 (two) times daily. 20 tablet 0  . cyclobenzaprine (FLEXERIL) 10 MG tablet Take 1 tablet (10 mg total) by mouth 3 (three) times daily as needed for muscle spasms. 30 tablet 0  . HYDROcodone-acetaminophen (NORCO) 10-325 MG per tablet Take 1 tablet by mouth every 6 (six) hours as needed.    . naproxen sodium (ANAPROX DS) 550 MG tablet Take 1 tablet (550 mg total) by mouth 2 (two) times daily with a meal. 40 tablet 0  . penicillin v potassium (VEETID) 500 MG tablet Take 500 mg by mouth 4 (four) times daily.    Marland Kitchen triamcinolone cream (KENALOG) 0.1 % Apply topically 2 (two) times daily. (Patient not taking: Reported on 11/04/2014) 45 g 0   No current facility-administered medications on file prior to visit.    ROS ROS otherwise unremarkable unless listed above.   Physical Examination: BP 142/84 mmHg  Pulse 82  Temp(Src) 98 F (36.7 C) (Oral)  Resp 18  Ht  (1.727 m)  Wt  216 lb (97.977 kg)  BMI 32.85 kg/m2  SpO2 96% Ideal Body Weight: Weight in (lb) to have BMI = 25: 164.1  Physical Exam  Constitutional: He is oriented to person, place, and time. He appears well-developed and well-nourished. No distress.  HENT:  Head: Normocephalic and atraumatic.  Eyes: Conjunctivae and EOM are normal. Pupils are equal, round, and reactive to light.  Cardiovascular: Normal rate.   Pulmonary/Chest: Effort normal. No respiratory distress.  Musculoskeletal:  No cervical spinous tenderness.  No bony tenderness along clavicular to acromion.   Pain incited  with abduction of right shoulder to the 100 degrees.  He is not able to abduct further.   Some pain incited at the anterior shoulder with internal rotation.  Positive neers at 100 degree.  Negative hawkins.  No pain with external rotation.  Positive empty can test.  No tenderness at bicipital groove.  Normal elbow rom and strength.  Negative sulcus.    Neurological: He is alert and oriented to person, place, and time.  Skin: Skin is warm and dry. He is not diaphoretic.  Psychiatric: He has a normal mood and affect. His behavior is normal.    [facility] reading (PRIMARY) by  Dr. Patsy Lager: Negative   Assessment and Plan: 39 year old male is here today for chief complaint of right shoulder pain for 3 months.  Diff dx: Includes rotator cuff injury/tear (supraspinatus), tendinitis, glenohumeral OA, etc.   Advised to start meloxicam daily, avoiding naproxen, ibuprofen advised. Ice 3-4 times a day for 15 minutes. Resting the right shoulder as much as possible. Due to numbness and tingling in 3 months of this issue, orthopedic consult is appreciated at this time.  I will attempt to get him in as soon as possible, before heavy work starts back.  Right shoulder pain - Plan: DG Shoulder Right, AMB referral to orthopedics, meloxicam (MOBIC) 15 MG tablet Judeth Cornfield Lenox Ponds, PA-C [facility] 9/12/20161:09 PM

## 2015-05-12 NOTE — Patient Instructions (Signed)
Please ice the shoulder 3 times per day for 15 minutes insuring barrier between skin and ice. I am referring you to orthopedic specialist at this time.  Please await contact. Please take this anti-inflammatory daily for the next 2 weeks.   Shoulder Pain The shoulder is the joint that connects your arms to your body. The bones that form the shoulder joint include the upper arm bone (humerus), the shoulder blade (scapula), and the collarbone (clavicle). The top of the humerus is shaped like a ball and fits into a rather flat socket on the scapula (glenoid cavity). A combination of muscles and strong, fibrous tissues that connect muscles to bones (tendons) support your shoulder joint and hold the ball in the socket. Small, fluid-filled sacs (bursae) are located in different areas of the joint. They act as cushions between the bones and the overlying soft tissues and help reduce friction between the gliding tendons and the bone as you move your arm. Your shoulder joint allows a wide range of motion in your arm. This range of motion allows you to do things like scratch your back or throw a ball. However, this range of motion also makes your shoulder more prone to pain from overuse and injury. Causes of shoulder pain can originate from both injury and overuse and usually can be grouped in the following four categories:  Redness, swelling, and pain (inflammation) of the tendon (tendinitis) or the bursae (bursitis).  Instability, such as a dislocation of the joint.  Inflammation of the joint (arthritis).  Broken bone (fracture). HOME CARE INSTRUCTIONS   Apply ice to the sore area.  Put ice in a plastic bag.  Place a towel between your skin and the bag.  Leave the ice on for 15-20 minutes, 3-4 times per day for the first 2 days, or as directed by your health care provider.  Stop using cold packs if they do not help with the pain.  If you have a shoulder sling or immobilizer, wear it as long as your  caregiver instructs. Only remove it to shower or bathe. Move your arm as little as possible, but keep your hand moving to prevent swelling.  Squeeze a soft ball or foam pad as much as possible to help prevent swelling.  Only take over-the-counter or prescription medicines for pain, discomfort, or fever as directed by your caregiver. SEEK MEDICAL CARE IF:   Your shoulder pain increases, or new pain develops in your arm, hand, or fingers.  Your hand or fingers become cold and numb.  Your pain is not relieved with medicines. SEEK IMMEDIATE MEDICAL CARE IF:   Your arm, hand, or fingers are numb or tingling.  Your arm, hand, or fingers are significantly swollen or turn white or blue. MAKE SURE YOU:   Understand these instructions.  Will watch your condition.  Will get help right away if you are not doing well or get worse. Document Released: 05/26/2005 Document Revised: 12/31/2013 Document Reviewed: 07/31/2011 [facility] Patient Information 2015 [place], [place]. This information is not intended to replace advice given to you by your health care provider. Make sure you discuss any questions you have with your health care provider.

## 2015-07-31 DIAGNOSIS — L02219 Cutaneous abscess of trunk, unspecified: Secondary | ICD-10-CM

## 2015-07-31 DIAGNOSIS — L03319 Cellulitis of trunk, unspecified: Secondary | ICD-10-CM

## 2015-07-31 HISTORY — DX: Cellulitis of trunk, unspecified: L03.319

## 2015-07-31 HISTORY — DX: Cutaneous abscess of trunk, unspecified: L02.219

## 2015-08-27 ENCOUNTER — Ambulatory Visit (INDEPENDENT_AMBULATORY_CARE_PROVIDER_SITE_OTHER): Payer: Commercial Managed Care - HMO | Admitting: Physician Assistant

## 2015-08-27 ENCOUNTER — Ambulatory Visit (INDEPENDENT_AMBULATORY_CARE_PROVIDER_SITE_OTHER): Payer: Commercial Managed Care - HMO

## 2015-08-27 VITALS — BP 130/82 | HR 73 | Temp 98.0°F | Resp 18 | Ht 68.0 in | Wt 210.6 lb

## 2015-08-27 DIAGNOSIS — M79675 Pain in left toe(s): Secondary | ICD-10-CM | POA: Diagnosis not present

## 2015-08-27 DIAGNOSIS — S92402A Displaced unspecified fracture of left great toe, initial encounter for closed fracture: Secondary | ICD-10-CM | POA: Diagnosis not present

## 2015-08-27 MED ORDER — HYDROCODONE-ACETAMINOPHEN 7.5-325 MG PO TABS: 1.0000 | ORAL_TABLET | Freq: Four times a day (QID) | ORAL | Status: DC | PRN

## 2015-08-27 NOTE — Progress Notes (Signed)
Patient ID: Joshua Leach, male    DOB: 04-Aug-1976, 39 y.o.   MRN: [id number]  PCP: No PCP Per Patient  Subjective:   Chief Complaint  Patient presents with  . Toe Injury    Poss. broken, yesterday, left toe    HPI Presents for evaluation of LEFT great toe pain.  Believes he broke his toe yesterday.  Opening the door of his vehicle and a piece of metal fell out, landing on his foot. The object weighed about 100 lbs and fell 1-2 feet. LEFT great toe is painful and swollen. Increased pain with ambulation, even worse with wearing a shoe. Naproxen with mild relief.  Review of Systems  Constitutional: Negative for fever and chills.  Musculoskeletal: Positive for arthralgias and gait problem.  Skin: Positive for color change.       Patient Active Problem List   Diagnosis Date Noted  . Tobacco abuse 07/05/2014     Prior to Admission medications   Medication Sig Start Date End Date Taking? Authorizing Provider  naproxen sodium (ANAPROX DS) 550 MG tablet Take 1 tablet (550 mg total) by mouth 2 (two) times daily with a meal. 11/04/14 11/04/15 Yes Carmelina Dane, MD     No Known Allergies     Objective:  Physical Exam  Constitutional: He is oriented to person, place, and time. He appears well-developed and well-nourished. No distress.  Eyes: Conjunctivae are normal.  Pulmonary/Chest: Effort normal.  Musculoskeletal:       Left ankle: Normal. Achilles tendon normal.       Left foot: There is decreased range of motion, tenderness, bony tenderness and swelling. There is normal capillary refill.       Feet:  Neurological: He is alert and oriented to person, place, and time.    LEFT great toe: FINDINGS: Mildly displaced oblique fracture is seen involving the first distal phalanx. This appears to be closed and posttraumatic. Joint spaces are intact. No soft tissue abnormality is noted.  IMPRESSION: Mildly displaced first distal phalangeal fracture.     Assessment & Plan:   1. Great toe pain, left - DG Toe Great Left; Future  2. Fractured great toe, left, closed, initial encounter Post-op shoe. Rest. Elevate. Continue NSAID. Norco as needed. RTC 1-2 weeks for re-evaluation, sooner if needed. - HYDROcodone-acetaminophen (NORCO) 7.5-325 MG tablet; Take 1 tablet by mouth every 6 (six) hours as needed.  Dispense: 30 tablet; Refill: 0   Fernande Bras, PA-C Physician Assistant-Certified [facility]

## 2015-08-28 ENCOUNTER — Ambulatory Visit: Payer: 59 | Admitting: Physician Assistant

## 2015-10-24 ENCOUNTER — Ambulatory Visit (INDEPENDENT_AMBULATORY_CARE_PROVIDER_SITE_OTHER): Payer: Commercial Managed Care - HMO | Admitting: Physician Assistant

## 2015-10-24 VITALS — BP 128/88 | HR 88 | Temp 98.3°F | Resp 16 | Ht 67.0 in | Wt 208.0 lb

## 2015-10-24 DIAGNOSIS — L0231 Cutaneous abscess of buttock: Secondary | ICD-10-CM | POA: Insufficient documentation

## 2015-10-24 MED ORDER — NAPROXEN SODIUM 550 MG PO TABS: 550.0000 mg | ORAL_TABLET | Freq: Two times a day (BID) | ORAL | Status: DC

## 2015-10-24 MED ORDER — DOXYCYCLINE HYCLATE 100 MG PO CAPS: 100.0000 mg | ORAL_CAPSULE | Freq: Two times a day (BID) | ORAL | Status: AC

## 2015-10-24 NOTE — Patient Instructions (Signed)

## 2015-10-24 NOTE — Progress Notes (Signed)
Subjective:    Patient ID: Joshua Leach, male    DOB: 1976-08-21, 40 y.o.   MRN: [id number]  Chief Complaint  Patient presents with  . Recurrent Skin Infections    pt. needs antibiotic for boil   HPI  Joshua Leach is a 40 year old Caucasian male who presents today with a chief complaint of an inflamed boil on his upper buttocks. He states he has had the boil for 20 years and that it flares up 1-2 times per year.   In Dec 2016 the boil was I&D at [facility] in [facility], [place]. [place] Spaniel it was cultured but he never received results. They gave him hydrocodeine for the pain, and an antibiotic for the infection but he can't remember which. The incision closed about 3 weeks ago.   2 days ago the boil started swelling up again. He believes is infected again and needs another dose of antibiotics. Describes significant pain with pressure to the bottom part of the boil. 0/10 pain with no pressure, but 10/10 with any palpation or pressure. Pain wakes him up at night. Is currently taking naproxen everyday for the pain with some relief.  PMH, FH, and SH were all reviewed with patient and updated as needed  No Known Allergies  Prior to Admission medications   Not on File    Review of Systems  Constitutional: Negative for fever, chills, diaphoresis and fatigue.  HENT: Negative for congestion, sinus pressure and sore throat.   Respiratory: Negative for cough, chest tightness and shortness of breath.   Cardiovascular: Negative for chest pain.  Gastrointestinal: Negative for nausea, vomiting, abdominal pain, diarrhea and constipation.  Neurological: Negative for dizziness, light-headedness and headaches.       Objective:   Physical Exam  Constitutional: He is oriented to person, place, and time. He appears well-developed and well-nourished.  Blood pressure 128/88, pulse 88, temperature 98.3 F (36.8 C), temperature source Oral, resp. rate 16, height  (1.702 m), weight  208 lb (94.348 kg), SpO2 98 %.   HENT:  Head: Normocephalic and atraumatic.  Right Ear: External ear normal.  Left Ear: External ear normal.  Mouth/Throat: Oropharynx is clear and moist. No oropharyngeal exudate.  Eyes: Conjunctivae and EOM are normal. Pupils are equal, round, and reactive to light.  Neck: Normal range of motion. Neck supple.  Cardiovascular: Normal rate, regular rhythm, S1 normal, S2 normal and intact distal pulses.  Exam reveals no gallop and no friction rub.   No murmur heard. Pulmonary/Chest: Effort normal and breath sounds normal. He has no wheezes. He has no rhonchi. He has no rales.  Lymphadenopathy:       Head (right side): No submental, no submandibular, no tonsillar, no preauricular, no posterior auricular and no occipital adenopathy present.       Head (left side): No submental, no submandibular, no tonsillar, no preauricular, no posterior auricular and no occipital adenopathy present.    He has no cervical adenopathy.       Right: No supraclavicular adenopathy present.       Left: No supraclavicular adenopathy present.  Neurological: He is alert and oriented to person, place, and time. He has normal strength. No sensory deficit.  Skin: Skin is warm and dry.     Psychiatric: He has a normal mood and affect. His speech is normal and behavior is normal.      Assessment & Plan:  1. Abscess of buttock, right Since the abscess is very tender to  palpation and the patient has such a long history of dealing with it, will refer him to surgery to have it excised. Instructed the patient to schedule an appointment with surgery for further evaluation.   Discussed with him our recommendation to I&D the abscess today due to it's inflamed and painful state. He refused, citing the severe pain from before, his belief that it will clear with antibiotics, and having to drive self home.   We counseled him to take doxyciline to help with the infection, the naproxen to help with  inflammation and pain, and use warm compress/warm bath to help soften the . Discussed possible side effects of his medications. Instructed to return if it doesn't improve or gets worse in the next couple of days or if he changes his mind about an I&D. Patient expressed his understanding and approval of this assessment and plan.  - doxycycline (VIBRAMYCIN) 100 MG capsule; Take 1 capsule (100 mg total) by mouth 2 (two) times daily.  Dispense: 20 capsule; Refill: 0 - naproxen sodium (ANAPROX DS) 550 MG tablet; Take 1 tablet (550 mg total) by mouth 2 (two) times daily with a meal.  Dispense: 60 tablet; Refill: 0 - Ambulatory referral to General Surgery

## 2015-10-24 NOTE — Progress Notes (Signed)
Patient ID: Joshua Leach, male    DOB: 23-Jan-1976, 40 y.o.   MRN: [id number]  PCP: No PCP Per Patient  Subjective:   Chief Complaint  Patient presents with  . Recurrent Skin Infections    pt. needs antibiotic for boil    HPI Presents for evaluation of an inflamed boil on his upper buttocks. He states he has had the boil for 20 years and that it flares up 1-2 times per year.   In Dec 2016 the boil was I&D at [facility] in [facility], [place]. [place] Spaniel it was cultured but he never received results. They gave him hydrocodone for the pain, and an antibiotic for the infection but he can't remember which. The incision closed about 3 weeks ago.   2 days ago the boil started swelling up again. He believes is infected again and needs another dose of antibiotics. Describes significant pain with pressure to the bottom part of the boil. 0/10 pain with no pressure, but 10/10 with any palpation or pressure. Pain wakes him up at night. Is currently taking naproxen everyday for the pain with some relief.  He is not willing or interested in I&D today due to the pain of sitting after the procedure, specifically because he will have to drive home.    Review of Systems Constitutional: Negative for fever, chills, diaphoresis and fatigue.  HENT: Negative for congestion, sinus pressure and sore throat.  Respiratory: Negative for cough, chest tightness and shortness of breath.  Cardiovascular: Negative for chest pain.  Gastrointestinal: Negative for nausea, vomiting, abdominal pain, diarrhea and constipation.  Neurological: Negative for dizziness, light-headedness and headaches.     Patient Active Problem List   Diagnosis Date Noted  . Tobacco abuse 07/05/2014     Prior to Admission medications   Not on File     No Known Allergies     Objective:  Physical Exam  Constitutional: He is oriented to person, place, and time. He appears well-developed and well-nourished. He is  active and cooperative. No distress.  BP 128/88 mmHg  Pulse 88  Temp(Src) 98.3 F (36.8 C) (Oral)  Resp 16  Ht  (1.702 m)  Wt 208 lb (94.348 kg)  BMI 32.57 kg/m2  SpO2 98%   Eyes: Conjunctivae are normal.  Pulmonary/Chest: Effort normal.  Genitourinary:     0.5 cm scar from previous I&D. Erythematous, tender, scant induration but fluctuance present.  Neurological: He is alert and oriented to person, place, and time.  Psychiatric: He has a normal mood and affect. His speech is normal and behavior is normal.           Assessment & Plan:   1. Abscess of buttock, right I&D recommended, which he declines. He expects that once he starts the antibiotic, the lesion will drain and feel better. Encouraged to seek definitive treatment of this issue, which has been ongoing x 20 years. In the meantime, treat the acute issue with doxy, warm compresses, NSAIDS. Avoid sitting when that is possible. RTC if he doesn't have significant improvement in the next 48 hours, for planned I&D. - doxycycline (VIBRAMYCIN) 100 MG capsule; Take 1 capsule (100 mg total) by mouth 2 (two) times daily.  Dispense: 20 capsule; Refill: 0 - naproxen sodium (ANAPROX DS) 550 MG tablet; Take 1 tablet (550 mg total) by mouth 2 (two) times daily with a meal.  Dispense: 60 tablet; Refill: 0 - Ambulatory referral to General Surgery   Fernande Bras, PA-C Physician Assistant-Certified  [facility]

## 2015-10-30 ENCOUNTER — Other Ambulatory Visit: Payer: Self-pay | Admitting: Surgery

## 2016-03-17 ENCOUNTER — Telehealth: Payer: Self-pay

## 2016-03-17 NOTE — Telephone Encounter (Signed)
Patient was treated for a boil.  It has burst.  Requesting antibiotic.  [facility] [address]  [phone]

## 2016-03-17 NOTE — Telephone Encounter (Signed)
He should come in for evaluation.

## 2016-03-18 NOTE — Telephone Encounter (Signed)
Pt advised to RTC. Pt understood. 

## 2016-07-03 ENCOUNTER — Encounter (HOSPITAL_COMMUNITY): Payer: Self-pay | Admitting: *Deleted

## 2016-07-03 ENCOUNTER — Ambulatory Visit (INDEPENDENT_AMBULATORY_CARE_PROVIDER_SITE_OTHER): Payer: BLUE CROSS/BLUE SHIELD

## 2016-07-03 ENCOUNTER — Ambulatory Visit (HOSPITAL_COMMUNITY)
Admission: EM | Admit: 2016-07-03 | Discharge: 2016-07-03 | Disposition: A | Discharge status: Home / Self Care | Payer: BLUE CROSS/BLUE SHIELD | Attending: Emergency Medicine | Admitting: Emergency Medicine

## 2016-07-03 DIAGNOSIS — J189 Pneumonia, unspecified organism: Secondary | ICD-10-CM

## 2016-07-03 MED ORDER — AZITHROMYCIN 250 MG PO TABS: ORAL_TABLET | ORAL | 0 refills | Status: DC

## 2016-07-03 MED ORDER — HYDROCOD POLST-CPM POLST ER 10-8 MG/5ML PO SUER: 5.0000 mL | Freq: Two times a day (BID) | ORAL | 0 refills | Status: DC | PRN

## 2016-07-03 NOTE — ED Provider Notes (Signed)
[facility]    CSN: [id number] Arrival date & time: 07/03/16  1432     History   Chief Complaint Chief Complaint  Patient presents with  . Cough    HPI Joshua Leach is a 40 y.o. male.   HPI He is a 40 year old man here for evaluation of cough. He reports a 3 to four-day history of cold symptoms including nasal congestion, rhinorrhea, and cough. Cough is productive of white phlegm. He also reports some mild shortness of breath and intermittent wheezing. He has been taking over-the-counter Robitussin and Goody powders with mild improvement. He reports a fever on Tuesday, but none since then. He had body aches for the first 2-3 days, but these have resolved. He also reports a sharp pain in his left lateral rib cage with deep breaths and coughing.  Past Medical History:  Diagnosis Date  . Cellulitis and abscess of trunk 07/2015   RIGHT buttock    Patient Active Problem List   Diagnosis Date Noted  . Abscess of buttock, right 10/24/2015  . Tobacco abuse 07/05/2014    Past Surgical History:  Procedure Laterality Date  . CYST EXCISION Left    LEFT eyebrow       Home Medications    Prior to Admission medications   Medication Sig Start Date End Date Taking? Authorizing Provider  azithromycin (ZITHROMAX Z-PAK) 250 MG tablet Take 2 pills today, then 1 pill daily until gone. 07/03/16   Charm RingsErin J Honig, MD  chlorpheniramine-HYDROcodone (TUSSIONEX PENNKINETIC ER) 10-8 MG/5ML SUER Take 5 mLs by mouth every 12 (twelve) hours as needed for cough. 07/03/16   Charm RingsErin J Honig, MD    Family History History reviewed. No pertinent family history.  Social History Social History  Substance Use Topics  . Smoking status: Current Every Day Smoker    Packs/day: 1.00    Years: 20.00  . Smokeless tobacco: Never Used     Comment: contemplating, due to cost  . Alcohol use 28.8 - 36.0 oz/week    48 - 60 Standard drinks or equivalent per week     Comment: 1-2 case beer/week      Allergies   Review of patient's allergies indicates no known allergies.   Review of Systems Review of Systems As in history of present illness  Physical Exam Triage Vital Signs ED Triage Vitals  Enc Vitals Group     BP 07/03/16 1535 150/93     Pulse Rate 07/03/16 1535 70     Resp 07/03/16 1535 16     Temp 07/03/16 1535 98.9 F (37.2 C)     Temp Source 07/03/16 1535 Oral     SpO2 07/03/16 1535 99 %     Weight --      Height --      Head Circumference --      Peak Flow --      Pain Score 07/03/16 1536 3     Pain Loc --      Pain Edu? --      Excl. in GC? --    No data found.   Updated Vital Signs BP 150/93 (BP Location: Right Arm)   Pulse 70   Temp 98.9 F (37.2 C) (Oral)   Resp 16   SpO2 99%   Visual Acuity Right Eye Distance:   Left Eye Distance:   Bilateral Distance:    Right Eye Near:   Left Eye Near:    Bilateral Near:     Physical Exam  Constitutional: He appears well-developed and well-nourished. No distress.  HENT:  Nasal mucosa is erythematous. Small amount of postnasal drainage seen.  Neck: Neck supple.  Cardiovascular: Normal rate, regular rhythm and normal heart sounds.   No murmur heard. Pulmonary/Chest: Effort normal. No respiratory distress. He has no wheezes. He has no rales. He exhibits no tenderness.  Wheezes present with cough  Lymphadenopathy:    He has no cervical adenopathy.     UC Treatments / Results  Labs (all labs ordered are listed, but only abnormal results are displayed) Labs Reviewed - No data to display  EKG  EKG Interpretation None       Radiology Dg Chest 2 View  Result Date: 07/03/2016 CLINICAL DATA:  Cough and fever EXAM: CHEST  2 VIEW COMPARISON:  07/05/2014 chest radiograph. FINDINGS: Stable cardiomediastinal silhouette with normal heart size. No pneumothorax. No pleural effusion. There is mild hazy opacity in the lingula. IMPRESSION: Mild hazy opacity in the lingula, which may indicate a  pneumonia. Recommend follow-up PA and lateral post treatment chest radiographs in 4-6 weeks. Electronically Signed   By: Delbert PhenixJason A Poff M.D.   On: 07/03/2016 16:18    Procedures Procedures (including critical care time)  Medications Ordered in UC Medications - No data to display   Initial Impression / Assessment and Plan / UC Course  I have reviewed the triage vital signs and the nursing notes.  Pertinent labs & imaging results that were available during my care of the patient were reviewed by me and considered in my medical decision making (see chart for details).  Clinical Course    Treat with azithromycin and Tussionex. No indication for admission at this time. Follow-up as needed.  Final Clinical Impressions(s) / UC Diagnoses   Final diagnoses:  Community acquired pneumonia of left lung, unspecified part of lung    New Prescriptions New Prescriptions   AZITHROMYCIN (ZITHROMAX Z-PAK) 250 MG TABLET    Take 2 pills today, then 1 pill daily until gone.   CHLORPHENIRAMINE-HYDROCODONE (TUSSIONEX PENNKINETIC ER) 10-8 MG/5ML SUER    Take 5 mLs by mouth every 12 (twelve) hours as needed for cough.     Charm RingsErin J Honig, MD 07/03/16 [phone]

## 2016-07-03 NOTE — ED Triage Notes (Addendum)
Patient reports 5 day history of productive cough with fever noted on Tuesday. Patient states history of PNA. Patient reports left sided chest pain associated with cough, states only comes with coughing spells or deep inspiration.

## 2016-07-03 NOTE — Discharge Instructions (Signed)
You are developing a pneumonia in your left lung. Take azithromycin as prescribed. Use the Tussionex at bedtime as needed for cough. Follow-up as needed.

## 2016-07-20 ENCOUNTER — Ambulatory Visit (INDEPENDENT_AMBULATORY_CARE_PROVIDER_SITE_OTHER): Payer: BLUE CROSS/BLUE SHIELD

## 2016-07-20 ENCOUNTER — Ambulatory Visit (INDEPENDENT_AMBULATORY_CARE_PROVIDER_SITE_OTHER): Payer: BLUE CROSS/BLUE SHIELD | Admitting: Physician Assistant

## 2016-07-20 VITALS — BP 124/80 | HR 78 | Temp 98.0°F | Resp 17 | Ht 67.5 in | Wt 217.0 lb

## 2016-07-20 DIAGNOSIS — R0781 Pleurodynia: Secondary | ICD-10-CM

## 2016-07-20 DIAGNOSIS — R05 Cough: Secondary | ICD-10-CM | POA: Diagnosis not present

## 2016-07-20 DIAGNOSIS — R059 Cough, unspecified: Secondary | ICD-10-CM

## 2016-07-20 MED ORDER — BENZONATATE 100 MG PO CAPS: 100.0000 mg | ORAL_CAPSULE | Freq: Three times a day (TID) | ORAL | 0 refills | Status: DC | PRN

## 2016-07-20 MED ORDER — MELOXICAM 7.5 MG PO TABS: 7.5000 mg | ORAL_TABLET | Freq: Every day | ORAL | 0 refills | Status: DC

## 2016-07-20 NOTE — Patient Instructions (Addendum)
Use meloxicam (1-2 tablets in the morning) for pain and inflammation daily for the next week. Use ice to affected area daily for at least 15 minutes.  If symptoms worsen or do not improve, follow up with us at this time.     IF you received an x-ray today, you will receive an invoice from [facility]. Please contact [facility] at [phone] with questions or concerns regarding your invoice.   IF you received labwork today, you will receive an invoice from [facility]. Please contact [facility] at [phone] with questions or concerns regarding your invoice.   Our billing staff will not be able to assist you with questions regarding bills from these companies.  You will be contacted with the lab results as soon as they are available. The fastest way to get your results is to activate your My Chart account. Instructions are located on the last page of this paperwork. If you have not heard from us regarding the results in 2 weeks, please contact this office.      Costochondritis Costochondritis is swelling and irritation (inflammation) of the tissue (cartilage) that connects your ribs to your breastbone (sternum). This causes pain in the front of your chest. Usually, the pain:  Starts gradually.  Is in more than one rib. This condition usually goes away on its own over time. Follow these instructions at home:  Do not do anything that makes your pain worse.  If directed, put ice on the painful area:  Put ice in a plastic bag.  Place a towel between your skin and the bag.  Leave the ice on for 20 minutes, 2-3 times a day.  If directed, put heat on the affected area as often as told by your doctor. Use the heat source that your doctor tells you to use, such as a moist heat pack or a heating pad.  Place a towel between your skin and the heat source.  Leave the heat on for 20-30 minutes.  Take off the heat if your skin turns bright red.  This is very important if you cannot feel pain, heat, or cold. You may have a greater risk of getting burned.  Take over-the-counter and prescription medicines only as told by your doctor.  Return to your normal activities as told by your doctor. Ask your doctor what activities are safe for you.  Keep all follow-up visits as told by your doctor. This is important. Contact a doctor if:  You have chills or a fever.  Your pain does not go away or it gets worse.  You have a cough that does not go away. Get help right away if:  You are short of breath. This information is not intended to replace advice given to you by your health care provider. Make sure you discuss any questions you have with your health care provider. Document Released: 02/02/2008 Document Revised: 03/05/2016 Document Reviewed: 12/10/2015 [employer] Interactive Patient Education  2017 [employer]

## 2016-07-20 NOTE — Progress Notes (Signed)
MRN: [id number] DOB: [birth year]  Subjective:   Joshua Leach is a 40 y.o. male presenting for chief complaint of patient states he thinks he has pneumonia . Pt initially seen on 07/03/16 for two weeks of cough, wheezing, fever, chills, left lung pain and his Xray showed left lobe pneumonia. He was given a zpack and tussionex. He completed his zpack on the 07/08/16. Notes his cough started to get better but he is still have left lung pain when he moves/coughs, wheezing and SOB. He wants to make sure he has not fractured a rib. Denies fever, chills, and NVD. Denies history of seasonal allergies, asthma, or COPD. Patient did not get flu shot this season. Pt is a smoker with 20 pack year history, has decreased smoking over the past few weeks. . He has tried naproxen for moderate relief.   Joshua Leach has a current medication list which includes the following prescription(s): chlorpheniramine-hydrocodone. Also has No Known Allergies.  Joshua Leach  has a past medical history of Cellulitis and abscess of trunk (07/2015). Also  has a past surgical history that includes Cyst excision (Left).   Objective:   Vitals: BP 124/80 (BP Location: Right Arm, Patient Position: Sitting, Cuff Size: Normal)   Pulse 78   Temp 98 F (36.7 C) (Oral)   Resp 17   Ht 5' 7.5" (1.715 m)   Wt 217 lb (98.4 kg)   SpO2 97%   BMI 33.49 kg/m   Physical Exam  Constitutional: He is oriented to person, place, and time. He appears well-developed and well-nourished.  HENT:  Head: Normocephalic and atraumatic.  Eyes: Conjunctivae are normal.  Neck: Normal range of motion.  Pulmonary/Chest: Effort normal and breath sounds normal.    Neurological: He is alert and oriented to person, place, and time.  Skin: Skin is warm and dry.  Psychiatric: He has a normal mood and affect.  Vitals reviewed.   No results found for this or any previous visit (from the past 24 hour(s)).   Dg Chest 1 View  Result Date:  07/20/2016 CLINICAL DATA:  Rib pain EXAM: CHEST 1 VIEW COMPARISON:  07/03/2016 FINDINGS: Single lateral view of the chest demonstrates no effusion or consolidations. IMPRESSION: Single lateral view demonstrates no effusion or consolidation. Please see PA and left rib series report which is dictated separately. Electronically Signed   By: Jasmine PangKim  Fujinaga M.D.   On: 07/20/2016 17:43   Dg Ribs Unilateral W/chest Left  Result Date: 07/20/2016 CLINICAL DATA:  Cough, left rib pain for 4 days EXAM: LEFT RIBS AND CHEST - 3+ VIEW COMPARISON:  07/03/2016 FINDINGS: Cardiomediastinal silhouette is stable. No infiltrate or pulmonary edema. No left rib fracture is identified. There is no pneumothorax. The IMPRESSION: Negative. Electronically Signed   By: Natasha MeadLiviu  Pop M.D.   On: 07/20/2016 17:42    Assessment and Plan :  1. Rib pain on left side -Normal imaging reassuring, pain is likely due to costochondritis, will treat with NSAIDs and ice - DG Ribs Unilateral W/Chest Left; Future - DG Chest 1 View; Future - meloxicam (MOBIC) 7.5 MG tablet; Take 1-2 tablets (7.5-15 mg total) by mouth daily.  Dispense: 30 tablet; Refill: 0  2. Cough -Imaging reassuring for resolved pneumonia - benzonatate (TESSALON) 100 MG capsule; Take 1-2 capsules (100-200 mg total) by mouth 3 (three) times daily as needed for cough.  Dispense: 40 capsule; Refill: 0  -Return to clinic if symptoms worsen, do not improve in one week, or as needed   GrenadaBrittany  Barnett AbuWiseman, PA-C  [facility] 07/20/2016 5:48 PM

## 2016-08-29 IMAGING — CR DG SHOULDER 2+V*R*
3 series · 3 of 3 positions shown · non-contrast
Comparison: None.

CLINICAL DATA: Right shoulder pain for 3 months with no known
trauma.

EXAM:
RIGHT SHOULDER - 2+ VIEW

[AP (1 of 2)]
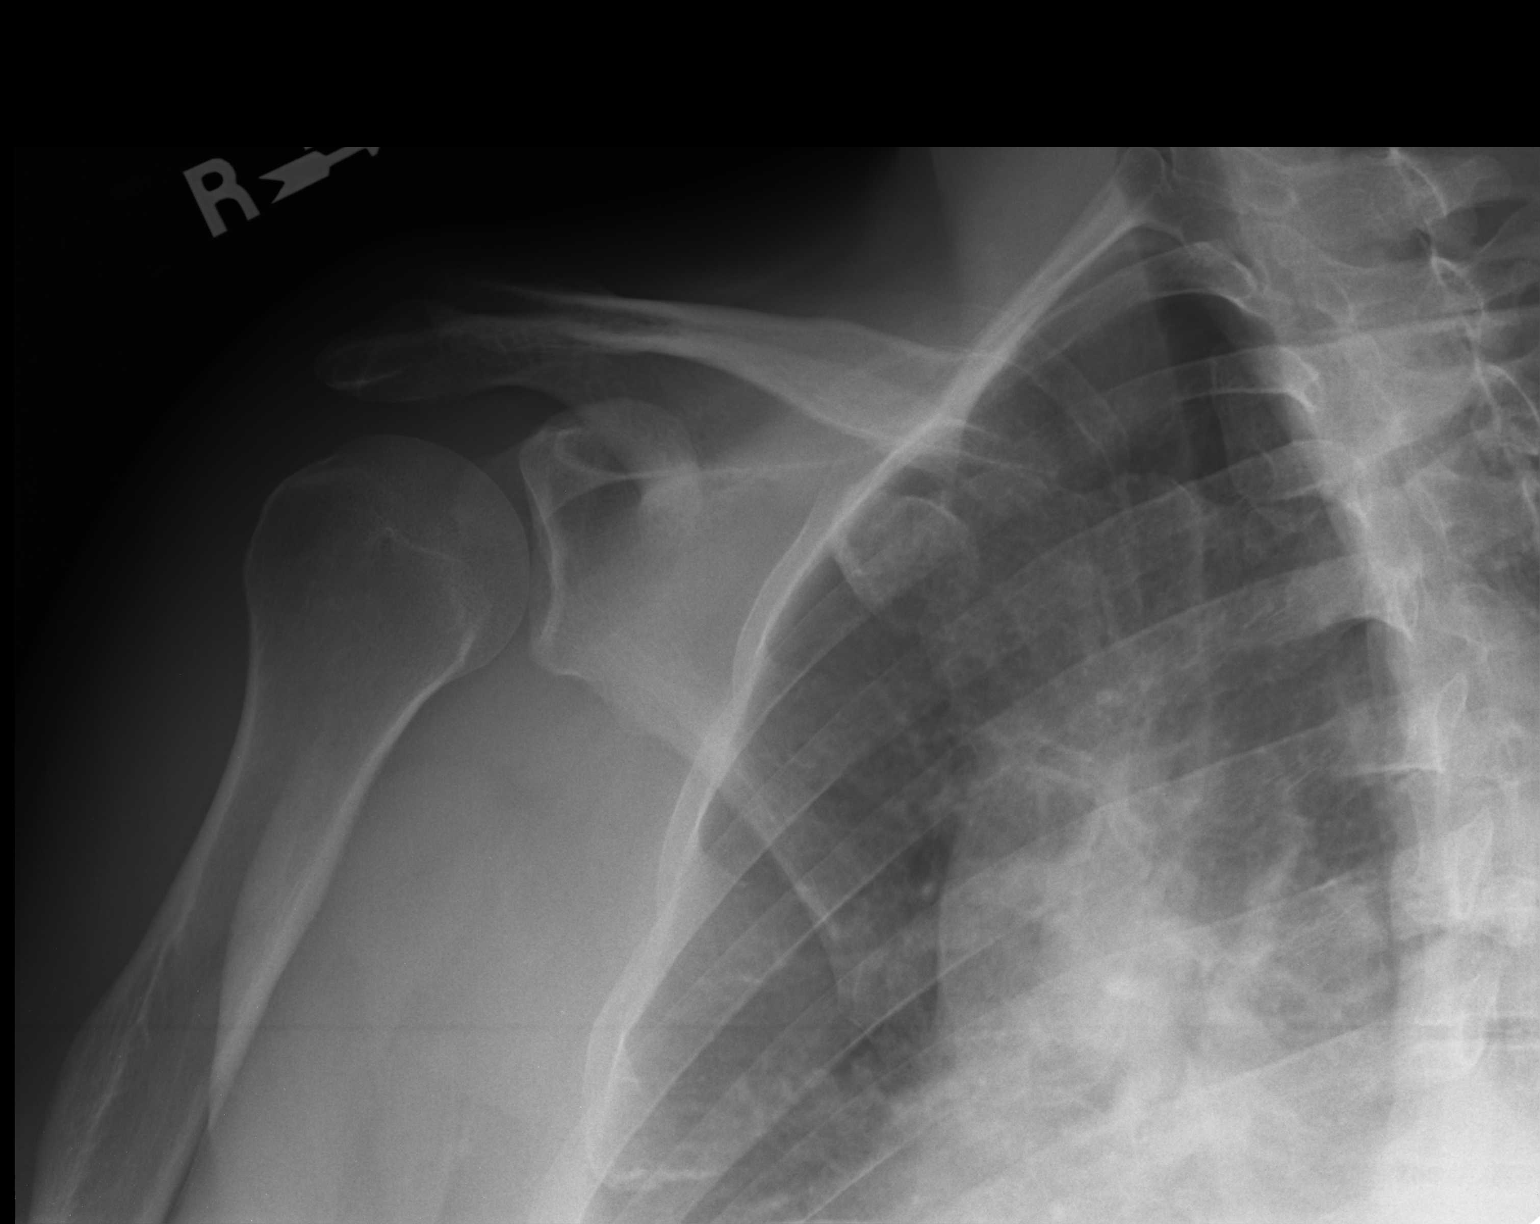

[AP (2 of 2)]
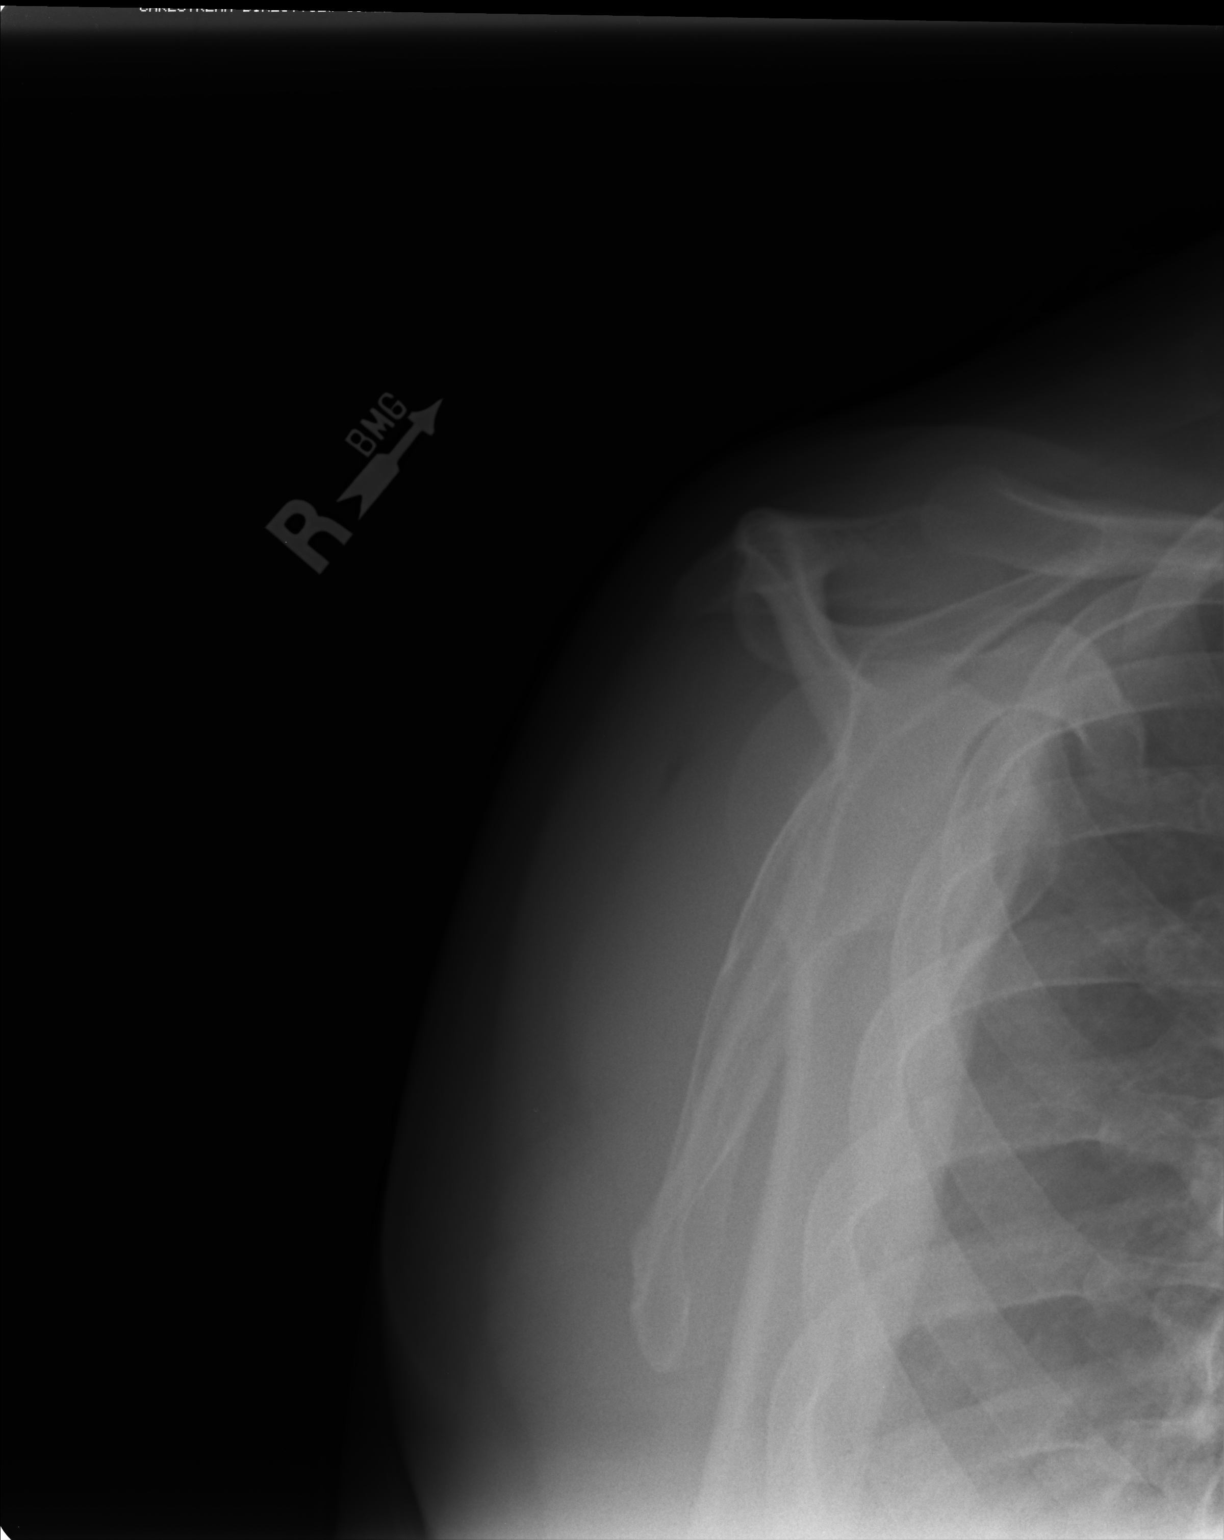

[axial]
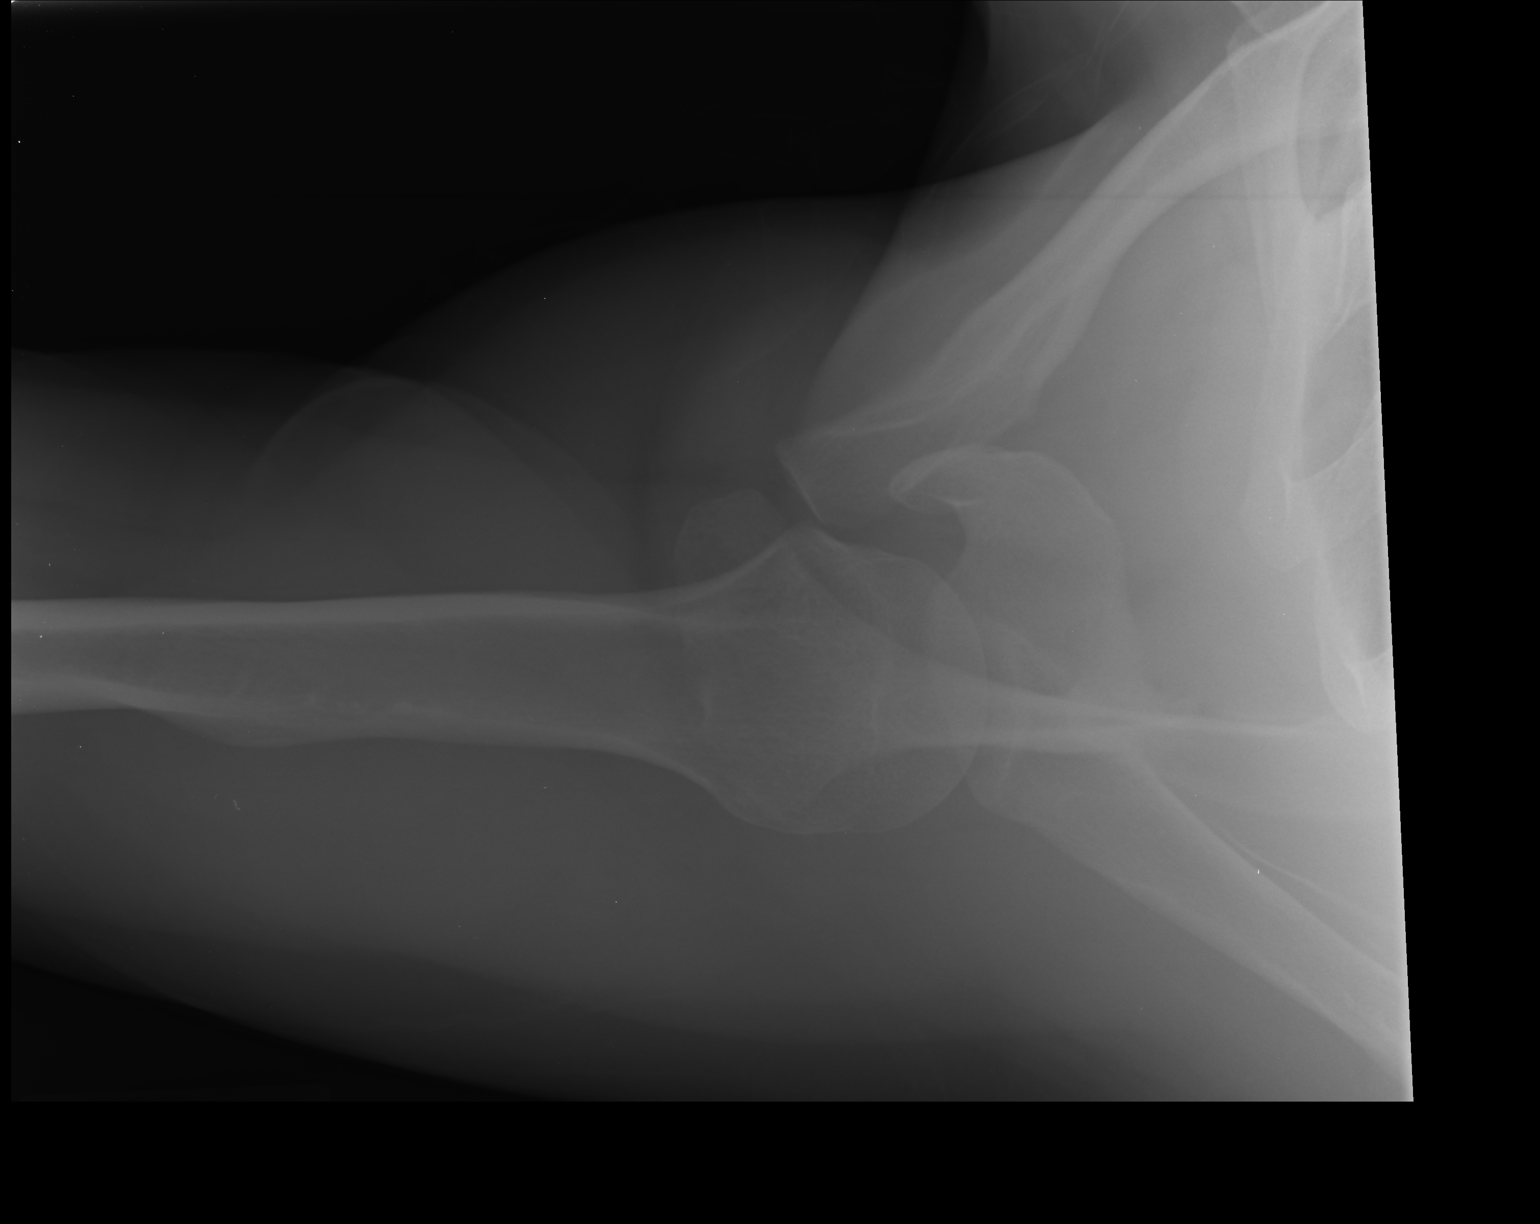

[3 of 3 positions shown; findings below may reference images not displayed]

FINDINGS: There is no evidence of fracture or dislocation. There is no
evidence of arthropathy or other focal bone abnormality. Soft
tissues are unremarkable.
IMPRESSION: Negative.

## 2016-12-14 IMAGING — CR DG TOE GREAT 2+V*L*
1 series · 1 of 1 positions shown · non-contrast
Comparison: None.

CLINICAL DATA: Left great toe pain after heavy metal object fell on
it yesterday.

EXAM:
LEFT GREAT TOE

[AP]
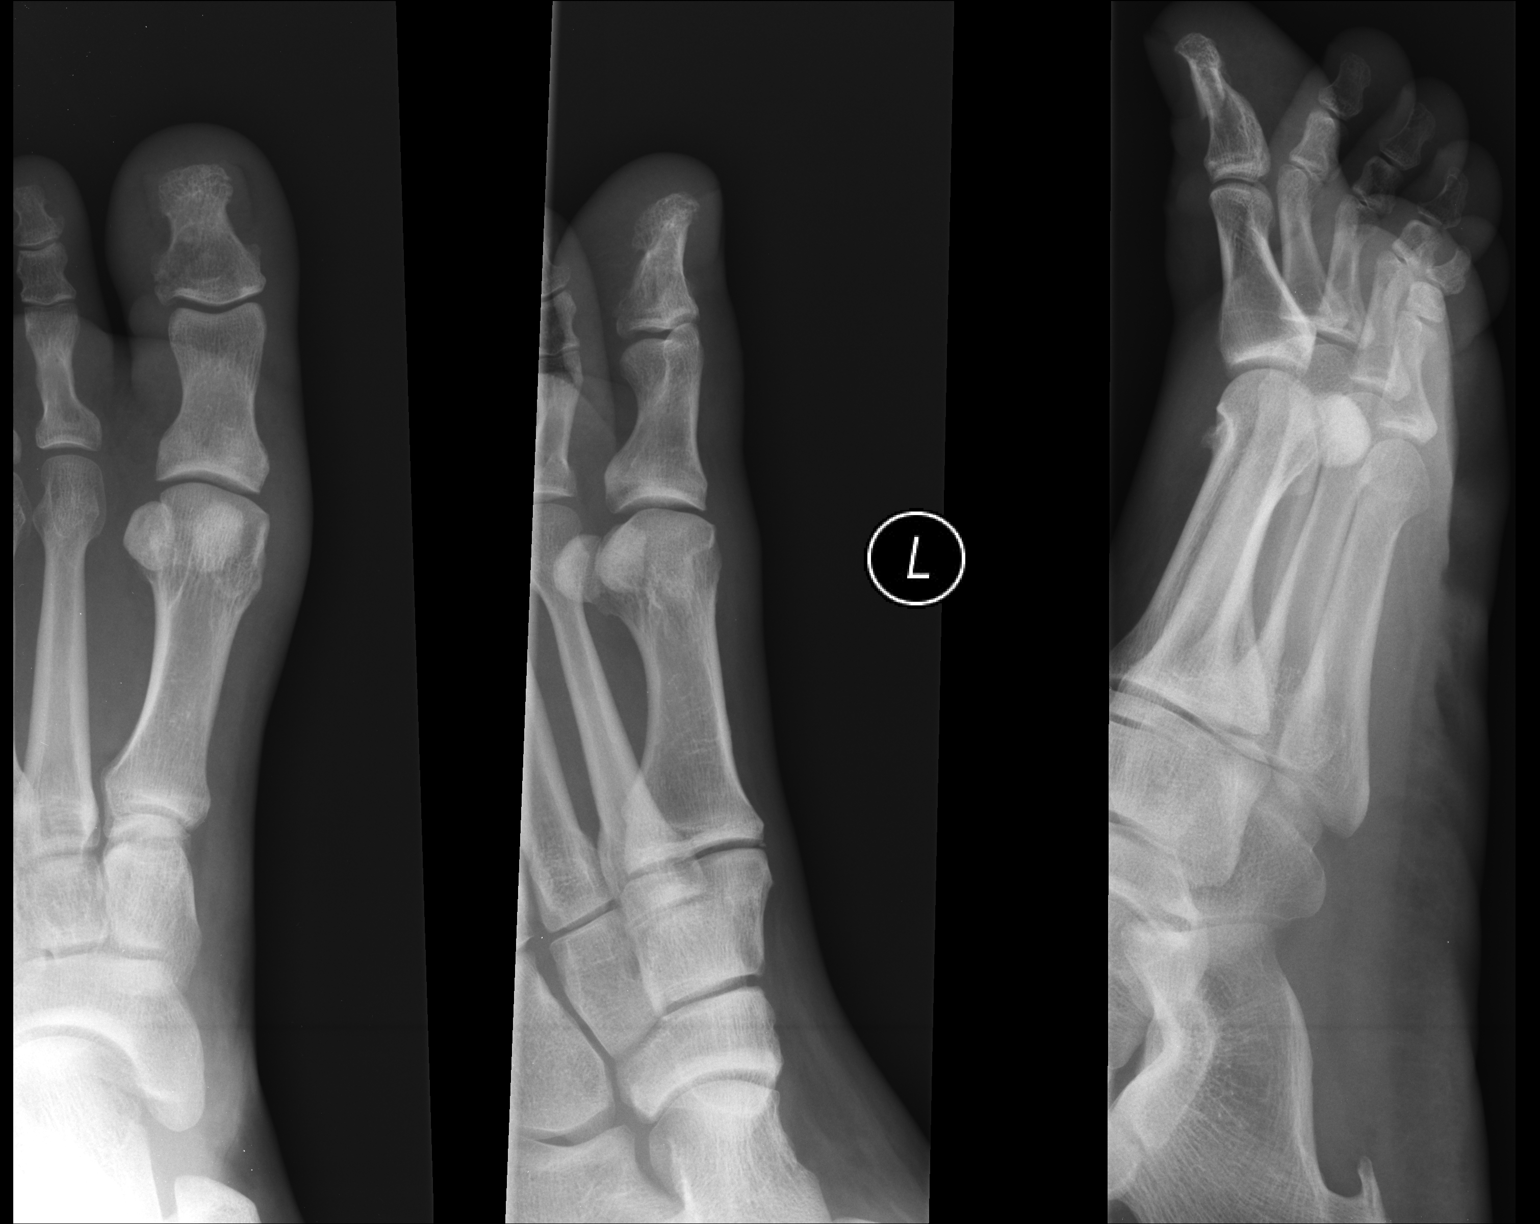

[1 of 1 positions shown; findings below may reference images not displayed]

FINDINGS: Mildly displaced oblique fracture is seen involving the first distal
phalanx. This appears to be closed and posttraumatic. Joint spaces
are intact. No soft tissue abnormality is noted.
IMPRESSION: Mildly displaced first distal phalangeal fracture.

## 2017-01-18 ENCOUNTER — Encounter: Payer: Self-pay | Admitting: Urgent Care

## 2017-01-18 ENCOUNTER — Ambulatory Visit (INDEPENDENT_AMBULATORY_CARE_PROVIDER_SITE_OTHER): Payer: Self-pay | Admitting: Urgent Care

## 2017-01-18 VITALS — BP 135/87 | HR 96 | Temp 98.6°F | Resp 17 | Ht 67.5 in | Wt 217.0 lb

## 2017-01-18 DIAGNOSIS — F17200 Nicotine dependence, unspecified, uncomplicated: Secondary | ICD-10-CM

## 2017-01-18 DIAGNOSIS — L723 Sebaceous cyst: Secondary | ICD-10-CM

## 2017-01-18 DIAGNOSIS — Z024 Encounter for examination for driving license: Secondary | ICD-10-CM

## 2017-01-18 DIAGNOSIS — L0591 Pilonidal cyst without abscess: Secondary | ICD-10-CM

## 2017-01-18 NOTE — Progress Notes (Signed)
  Commercial Driver Medical Examination   Joshua Leach is a 41 y.o. 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 The patient reports smoking ~1ppd, is trying to quit. 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 Denies dizziness, chronic headache, blurred vision, chest pain, shortness of breath, heart racing, palpitations, nausea, vomiting, abdominal pain, hematuria, lower leg swelling.   The following portions of the patient's history were reviewed and updated as appropriate: allergies, current medications, past family history, past medical history, past social history and past surgical history.  Objective:   BP 135/87   Pulse 96   Temp 98.6 F (37 C) (Oral)   Resp 17   Ht 5' 7.5" (1.715 m)   Wt 217 lb (98.4 kg)   SpO2 98%   BMI 33.49 kg/m   Vision/hearing:  Visual Acuity Screening   Right eye Left eye Both eyes  Without correction: 20/20 20/20 20/20   With correction:     Hearing Screening Comments: Peripheral Vision: Right eye 85 degrees. Left eye 85 degrees. The patient can distinguish the colors red, amber and green. The patient was able to hear a forced whisper from L=10 R=10 feet.  Patient can recognize and distinguish among traffic control signals and devices showing standard red, green, and amber colors.  Corrective lenses required: No  Monocular Vision?: No  Hearing aid requirement: No  Physical Exam  Constitutional: He is oriented to person, place, and time. He appears well-developed and well-nourished.  HENT:  TM's intact bilaterally, no effusions or erythema. Nasal turbinates pink and moist, nasal passages patent. No sinus tenderness. Oropharynx clear, mucous membranes moist, dentition in good repair.  Eyes: Conjunctivae and EOM are normal. Pupils are equal, round, and reactive to light. Right eye exhibits no discharge. Left eye exhibits no discharge. No scleral icterus.    Neck: Normal range of motion. Neck supple. No thyromegaly  present.  Cardiovascular: Normal rate, regular rhythm and intact distal pulses.  Exam reveals no gallop and no friction rub.   No murmur heard. Pulmonary/Chest: No stridor. No respiratory distress. He has no wheezes. He has no rales.  Abdominal: Soft. Bowel sounds are normal. He exhibits no distension and no mass. There is no tenderness.  Musculoskeletal: Normal range of motion. He exhibits no edema or tenderness.  Lymphadenopathy:    He has no cervical adenopathy.  Neurological: He is alert and oriented to person, place, and time. He has normal reflexes. He displays normal reflexes. Coordination normal.  Skin: Skin is warm and dry. No rash noted. No erythema. No pallor.  Psychiatric: He has a normal mood and affect.   Labs: Comments: spgr:1.015, Glu:neg Pro:neg Blood:neg  Assessment:    Healthy male exam.  Meets standards in 3149 CFR 391.41;  qualifies for 2 year certificate.    Plan:   Medical examiners certificate completed and printed. Return as needed.  Wallis BambergMario Braheem Tomasik, PA-C [facility] [phone] 01/18/2017  9:04 AM

## 2017-01-18 NOTE — Patient Instructions (Signed)
     IF you received an x-ray today, you will receive an invoice from [facility]. Please contact [facility] at [phone] with questions or concerns regarding your invoice.   IF you received labwork today, you will receive an invoice from [facility]. Please contact [facility] at [phone] with questions or concerns regarding your invoice.   Our billing staff will not be able to assist you with questions regarding bills from these companies.  You will be contacted with the lab results as soon as they are available. The fastest way to get your results is to activate your My Chart account. Instructions are located on the last page of this paperwork. If you have not heard from us regarding the results in 2 weeks, please contact this office.     

## 2017-06-06 ENCOUNTER — Ambulatory Visit (INDEPENDENT_AMBULATORY_CARE_PROVIDER_SITE_OTHER): Payer: BLUE CROSS/BLUE SHIELD | Admitting: Physician Assistant

## 2017-06-06 ENCOUNTER — Encounter: Payer: Self-pay | Admitting: Physician Assistant

## 2017-06-06 VITALS — BP 118/80 | HR 81 | Temp 98.1°F | Resp 18 | Ht 67.5 in | Wt 207.0 lb

## 2017-06-06 DIAGNOSIS — J22 Unspecified acute lower respiratory infection: Secondary | ICD-10-CM | POA: Diagnosis not present

## 2017-06-06 MED ORDER — GUAIFENESIN ER 1200 MG PO TB12: 1.0000 | ORAL_TABLET | Freq: Two times a day (BID) | ORAL | 1 refills | Status: DC | PRN

## 2017-06-06 MED ORDER — BENZONATATE 100 MG PO CAPS: 100.0000 mg | ORAL_CAPSULE | Freq: Three times a day (TID) | ORAL | 0 refills | Status: DC | PRN

## 2017-06-06 MED ORDER — PREDNISONE 20 MG PO TABS: ORAL_TABLET | ORAL | 0 refills | Status: DC

## 2017-06-06 MED ORDER — ALBUTEROL SULFATE HFA 108 (90 BASE) MCG/ACT IN AERS: 2.0000 | INHALATION_SPRAY | RESPIRATORY_TRACT | 1 refills | Status: DC | PRN

## 2017-06-06 MED ORDER — AZITHROMYCIN 250 MG PO TABS: ORAL_TABLET | ORAL | 0 refills | Status: DC

## 2017-06-06 MED ORDER — HYDROCOD POLST-CPM POLST ER 10-8 MG/5ML PO SUER: 5.0000 mL | Freq: Two times a day (BID) | ORAL | 0 refills | Status: DC | PRN

## 2017-06-06 NOTE — Patient Instructions (Addendum)
  Community-Acquired Pneumonia, Adult Pneumonia is an infection of the lungs. One type of pneumonia can happen while a person is in a hospital. A different type can happen when a person is not in a hospital (community-acquired pneumonia). It is easy for this kind to spread from person to person. It can spread to you if you breathe near an infected person who coughs or sneezes. Some symptoms include:  A dry cough.  A wet (productive) cough.  Fever.  Sweating.  Chest pain.  Follow these instructions at home:  Take over-the-counter and prescription medicines only as told by your doctor. ? Only take cough medicine if you are losing sleep. ? If you were prescribed an antibiotic medicine, take it as told by your doctor. Do not stop taking the antibiotic even if you start to feel better.  Sleep with your head and neck raised (elevated). You can do this by putting a few pillows under your head, or you can sleep in a recliner.  Do not use tobacco products. These include cigarettes, chewing tobacco, and e-cigarettes. If you need help quitting, ask your doctor.  Drink enough water to keep your pee (urine) clear or pale yellow. A shot (vaccine) can help prevent pneumonia. Shots are often suggested for:  People older than 41 years of age.  People older than 41 years of age: ? Who are having cancer treatment. ? Who have long-term (chronic) lung disease. ? Who have problems with their body's defense system (immune system).  You may also prevent pneumonia if you take these actions:  Get the flu (influenza) shot every year.  Go to the dentist as often as told.  Wash your hands often. If soap and water are not available, use hand sanitizer.  Contact a doctor if:  You have a fever.  You lose sleep because your cough medicine does not help. Get help right away if:  You are short of breath and it gets worse.  You have more chest pain.  Your sickness gets worse. This is very serious  if: ? You are an older adult. ? Your body's defense system is weak.  You cough up blood. This information is not intended to replace advice given to you by your health care provider. Make sure you discuss any questions you have with your health care provider. Document Released: 02/02/2008 Document Revised: 01/22/2016 Document Reviewed: 12/11/2014 Elsevier Interactive Patient Education  2018 Elsevier Inc.   IF you received an x-ray today, you will receive an invoice from [facility]. Please contact [facility] at [phone] with questions or concerns regarding your invoice.   IF you received labwork today, you will receive an invoice from [facility]. Please contact [facility] at [phone] with questions or concerns regarding your invoice.   Our billing staff will not be able to assist you with questions regarding bills from these companies.  You will be contacted with the lab results as soon as they are available. The fastest way to get your results is to activate your My Chart account. Instructions are located on the last page of this paperwork. If you have not heard from us regarding the results in 2 weeks, please contact this office.      

## 2017-06-06 NOTE — Progress Notes (Signed)
PRIMARY CARE AT [facility] [address], [place] [address] [phone]  Date:  06/06/2017   Name:  Joshua Leach   DOB:  1975-09-03   MRN:  [id number]  PCP:  Patient, No Pcp Per    History of Present Illness:  Joshua Leach is a 41 y.o. male patient who presents to PCP with  Chief Complaint  Patient presents with  . Cough    x 1 month     1 month of persistent cough, runny-nose, fever and cold chills in the beginning.  He had some shortness of breath initially but this has stopped.  Chest pain secondary to the coughing.  He has an occasional productive cough of thick white mucus.  It was green.  No ear fullness.  No throat pain.  He has some mild fatigue.  He had an inhaler when he was not feeling well.   He has tried robitussin, nyquil. Sick contact of his brother  Patient Active Problem List   Diagnosis Date Noted  . Abscess of buttock, right 10/24/2015  . Tobacco abuse 07/05/2014    Past Medical History:  Diagnosis Date  . Cellulitis and abscess of trunk 07/2015   RIGHT buttock    Past Surgical History:  Procedure Laterality Date  . CYST EXCISION Left    LEFT eyebrow    Social History  Substance Use Topics  . Smoking status: Current Every Day Smoker    Packs/day: 1.00    Years: 20.00  . Smokeless tobacco: Never Used     Comment: contemplating, due to cost  . Alcohol use 28.8 - 36.0 oz/week    48 - 60 Standard drinks or equivalent per week     Comment: 1-2 case beer/week    History reviewed. No pertinent family history.  No Known Allergies  Medication list has been reviewed and updated.  No current outpatient prescriptions on file prior to visit.   No current facility-administered medications on file prior to visit.     ROS ROS otherwise unremarkable unless listed above.  Physical Examination: BP 118/80   Pulse 81   Temp 98.1 F (36.7 C) (Oral)   Resp 18   Ht 5' 7.5" (1.715 m)   Wt 207 lb (93.9 kg)   SpO2 98%   BMI 31.94 kg/m   Ideal Body Weight: Weight in (lb) to have BMI = 25: 161.7  Physical Exam  Constitutional: He is oriented to person, place, and time. He appears well-developed and well-nourished. No distress.  HENT:  Head: Normocephalic and atraumatic.  Right Ear: Tympanic membrane, external ear and ear canal normal.  Left Ear: Tympanic membrane, external ear and ear canal normal.  Nose: Mucosal edema and rhinorrhea present. Right sinus exhibits no maxillary sinus tenderness and no frontal sinus tenderness. Left sinus exhibits no maxillary sinus tenderness and no frontal sinus tenderness.  Mouth/Throat: No uvula swelling. No oropharyngeal exudate, posterior oropharyngeal edema or posterior oropharyngeal erythema.  Eyes: Pupils are equal, round, and reactive to light. Conjunctivae, EOM and lids are normal. Right eye exhibits normal extraocular motion. Left eye exhibits normal extraocular motion.  Neck: Trachea normal and full passive range of motion without pain. No edema and no erythema present.  Cardiovascular: Normal rate.   Pulmonary/Chest: Effort normal. No respiratory distress. He has no decreased breath sounds. He has wheezes (expiratory lung bases). He has rhonchi.  Neurological: He is alert and oriented to person, place, and time.  Skin: Skin is warm and dry. He is not  diaphoretic.  Psychiatric: He has a normal mood and affect. His behavior is normal.     Assessment and Plan: [name] is a 41 y.o. male who is here today for cc of  Chief Complaint  Patient presents with  . Cough    x 1 month  alarming sxs to warrant immediate return were given Rtc as needed.  Lower respiratory infection (e.g., bronchitis, pneumonia, pneumonitis, pulmonitis) - Plan: azithromycin (ZITHROMAX) 250 MG tablet, predniSONE (DELTASONE) 20 MG tablet, chlorpheniramine-HYDROcodone (TUSSIONEX PENNKINETIC ER) 10-8 MG/5ML SUER, albuterol (PROVENTIL HFA;VENTOLIN HFA) 108 (90 Base) MCG/ACT inhaler, benzonatate  (TESSALON) 100 MG capsule, Guaifenesin (MUCINEX MAXIMUM STRENGTH) 1200 MG TB12  Trena Platt, PA-C [facility] 10/9/201811:12 AM

## 2017-08-17 ENCOUNTER — Ambulatory Visit: Payer: BLUE CROSS/BLUE SHIELD | Admitting: Physician Assistant

## 2017-08-17 ENCOUNTER — Other Ambulatory Visit: Payer: Self-pay

## 2017-08-17 ENCOUNTER — Encounter: Payer: Self-pay | Admitting: Physician Assistant

## 2017-08-17 VITALS — HR 94 | Temp 97.6°F | Resp 16 | Ht 67.5 in | Wt 221.8 lb

## 2017-08-17 DIAGNOSIS — J069 Acute upper respiratory infection, unspecified: Secondary | ICD-10-CM | POA: Diagnosis not present

## 2017-08-17 MED ORDER — BENZONATATE 100 MG PO CAPS: 100.0000 mg | ORAL_CAPSULE | Freq: Three times a day (TID) | ORAL | 0 refills | Status: DC | PRN

## 2017-08-17 MED ORDER — HYDROCOD POLST-CPM POLST ER 10-8 MG/5ML PO SUER: 5.0000 mL | Freq: Every evening | ORAL | 0 refills | Status: DC | PRN

## 2017-08-17 MED ORDER — IPRATROPIUM BROMIDE 0.03 % NA SOLN: 2.0000 | Freq: Two times a day (BID) | NASAL | 0 refills | Status: DC

## 2017-08-17 NOTE — Progress Notes (Signed)
[facility] [address], [place] [address] [phone]  Date:  08/17/2017   Name:  Joshua Leach   DOB:  October 11, 1975   MRN:  [id number]  PCP:  Patient, No Pcp Per    History of Present Illness:  Joshua Leach is a 41 y.o. male patient who presents to PCP with  Chief Complaint  Patient presents with  . Cough    productive with white mucus, also nasal congestion, aches after cough. Patient had dx of pneumonia 05/2017     Patient reports productive cough with white mucus and nasal congestion.  He notes no sob or dyspnea.  Runny nose.  No fever.     Patient Active Problem List   Diagnosis Date Noted  . Abscess of buttock, right 10/24/2015  . Tobacco abuse 07/05/2014    Past Medical History:  Diagnosis Date  . Cellulitis and abscess of trunk 07/2015   RIGHT buttock    Past Surgical History:  Procedure Laterality Date  . CYST EXCISION Left    LEFT eyebrow    Social History   Tobacco Use  . Smoking status: Current Every Day Smoker    Packs/day: 1.00    Years: 20.00    Pack years: 20.00  . Smokeless tobacco: Never Used  . Tobacco comment: contemplating, due to cost  Substance Use Topics  . Alcohol use: Yes    Alcohol/week: 28.8 - 36.0 oz    Types: 48 - 60 Standard drinks or equivalent per week    Comment: 1-2 case beer/week  . Drug use: No    No family history on file.  No Known Allergies  Medication list has been reviewed and updated.  Current Outpatient Medications on File Prior to Visit  Medication Sig Dispense Refill  . Guaifenesin (MUCINEX MAXIMUM STRENGTH) 1200 MG TB12 Take 1 tablet (1,200 mg total) by mouth every 12 (twelve) hours as needed. 14 tablet 1  . albuterol (PROVENTIL HFA;VENTOLIN HFA) 108 (90 Base) MCG/ACT inhaler Inhale 2 puffs into the lungs every 4 (four) hours as needed for wheezing or shortness of breath (cough, shortness of breath or wheezing.). (Patient not taking: Reported on 08/17/2017) 1 Inhaler 1  .  azithromycin (ZITHROMAX) 250 MG tablet Take 2 tabs PO x 1 dose, then 1 tab PO QD x 4 days (Patient not taking: Reported on 08/17/2017) 6 tablet 0  . benzonatate (TESSALON) 100 MG capsule Take 1-2 capsules (100-200 mg total) by mouth 3 (three) times daily as needed for cough. (Patient not taking: Reported on 08/17/2017) 40 capsule 0  . chlorpheniramine-HYDROcodone (TUSSIONEX PENNKINETIC ER) 10-8 MG/5ML SUER Take 5 mLs by mouth every 12 (twelve) hours as needed. (Patient not taking: Reported on 08/17/2017) 50 mL 0  . predniSONE (DELTASONE) 20 MG tablet Take 3 PO QAM x2days, 2 PO QAM x2days, 1 PO QAM x3days (Patient not taking: Reported on 08/17/2017) 13 tablet 0   No current facility-administered medications on file prior to visit.     ROS ROS otherwise unremarkable unless listed above.  Physical Examination: Pulse 94   Temp 97.6 F (36.4 C) (Oral)   Resp 16   Ht 5' 7.5" (1.715 m)   Wt 221 lb 12.8 oz (100.6 kg)   SpO2 98%   BMI 34.23 kg/m  Ideal Body Weight: Weight in (lb) to have BMI = 25: 161.7  Physical Exam  Constitutional: He is oriented to person, place, and time. He appears well-developed and well-nourished. No distress.  HENT:  Head:  Normocephalic and atraumatic.  Right Ear: Tympanic membrane, external ear and ear canal normal.  Left Ear: Tympanic membrane, external ear and ear canal normal.  Nose: Mucosal edema and rhinorrhea present. Right sinus exhibits no maxillary sinus tenderness and no frontal sinus tenderness. Left sinus exhibits no maxillary sinus tenderness and no frontal sinus tenderness.  Mouth/Throat: No uvula swelling. No oropharyngeal exudate, posterior oropharyngeal edema or posterior oropharyngeal erythema.  Mucus in the back of throat consistent with pnd  Eyes: Conjunctivae, EOM and lids are normal. Pupils are equal, round, and reactive to light. Right eye exhibits normal extraocular motion. Left eye exhibits normal extraocular motion.  Neck: Trachea normal  and full passive range of motion without pain. No edema and no erythema present.  Cardiovascular: Normal rate.  Pulmonary/Chest: Effort normal. No respiratory distress. He has no decreased breath sounds. He has no wheezes. He has no rhonchi.  Neurological: He is alert and oriented to person, place, and time.  Skin: Skin is warm and dry. He is not diaphoretic.  Psychiatric: He has a normal mood and affect. His behavior is normal.     Assessment and Plan: Joshua Leach is a 41 y.o. male who is here today for cc of  Chief Complaint  Patient presents with  . Cough    productive with white mucus, also nasal congestion, aches after cough. Patient had dx of pneumonia 05/2017   Acute upper respiratory infection - Plan: benzonatate (TESSALON) 100 MG capsule, ipratropium (ATROVENT) 0.03 % nasal spray, chlorpheniramine-HYDROcodone (TUSSIONEX PENNKINETIC ER) 10-8 MG/5ML SUER  Trena PlattStephanie English, PA-C [facility] 12/29/20187:24 PM

## 2017-08-17 NOTE — Patient Instructions (Addendum)
Please continue to use the mucinex, 1200mg  every 12 hours. I would like you to make sure you are hydrating well, If you continue to feel poorly, please let me know after 6 days.  Viruses generally last 7-10 days.  Take ibuprofen for pain and fever.  600mg  every 6-8 hours can be helpful toward any discomfort.   Upper Respiratory Infection, Adult Most upper respiratory infections (URIs) are caused by a virus. A URI affects the nose, throat, and upper air passages. The most common type of URI is often called "the common cold." Follow these instructions at home:  Take medicines only as told by your doctor.  Gargle warm saltwater or take cough drops to comfort your throat as told by your doctor.  Use a warm mist humidifier or inhale steam from a shower to increase air moisture. This may make it easier to breathe.  Drink enough fluid to keep your pee (urine) clear or pale yellow.  Eat soups and other clear broths.  Have a healthy diet.  Rest as needed.  Go back to work when your fever is gone or your doctor says it is okay. ? You may need to stay home longer to avoid giving your URI to others. ? You can also wear a face mask and wash your hands often to prevent spread of the virus.  Use your inhaler more if you have asthma.  Do not use any tobacco products, including cigarettes, chewing tobacco, or electronic cigarettes. If you need help quitting, ask your doctor. Contact a doctor if:  You are getting worse, not better.  Your symptoms are not helped by medicine.  You have chills.  You are getting more short of breath.  You have brown or red mucus.  You have yellow or brown discharge from your nose.  You have pain in your face, especially when you bend forward.  You have a fever.  You have puffy (swollen) neck glands.  You have pain while swallowing.  You have white areas in the back of your throat. Get help right away if:  You have very bad or  constant: ? Headache. ? Ear pain. ? Pain in your forehead, behind your eyes, and over your cheekbones (sinus pain). ? Chest pain.  You have long-lasting (chronic) lung disease and any of the following: ? Wheezing. ? Long-lasting cough. ? Coughing up blood. ? A change in your usual mucus.  You have a stiff neck.  You have changes in your: ? Vision. ? Hearing. ? Thinking. ? Mood. This information is not intended to replace advice given to you by your health care provider. Make sure you discuss any questions you have with your health care provider. Document Released: 02/02/2008 Document Revised: 04/18/2016 Document Reviewed: 11/21/2013 Elsevier Interactive Patient Education  2018 [employer]    IF you received an x-ray today, you will receive an invoice from [facility]. Please contact [facility] at [phone] with questions or concerns regarding your invoice.   IF you received labwork today, you will receive an invoice from [facility]. Please contact [facility] at [phone] with questions or concerns regarding your invoice.   Our billing staff will not be able to assist you with questions regarding bills from these companies.  You will be contacted with the lab results as soon as they are available. The fastest way to get your results is to activate your My Chart account. Instructions are located on the last page of this paperwork. If you have not heard from us  regarding the results in 2 weeks, please contact this office.

## 2017-11-07 IMAGING — DX DG RIBS W/ CHEST 3+V*L*
3 series · 3 of 3 positions shown · non-contrast
Comparison: 07/03/2016

CLINICAL DATA: Cough, left rib pain for 4 days

EXAM:
LEFT RIBS AND CHEST - 3+ VIEW

[chest pa]
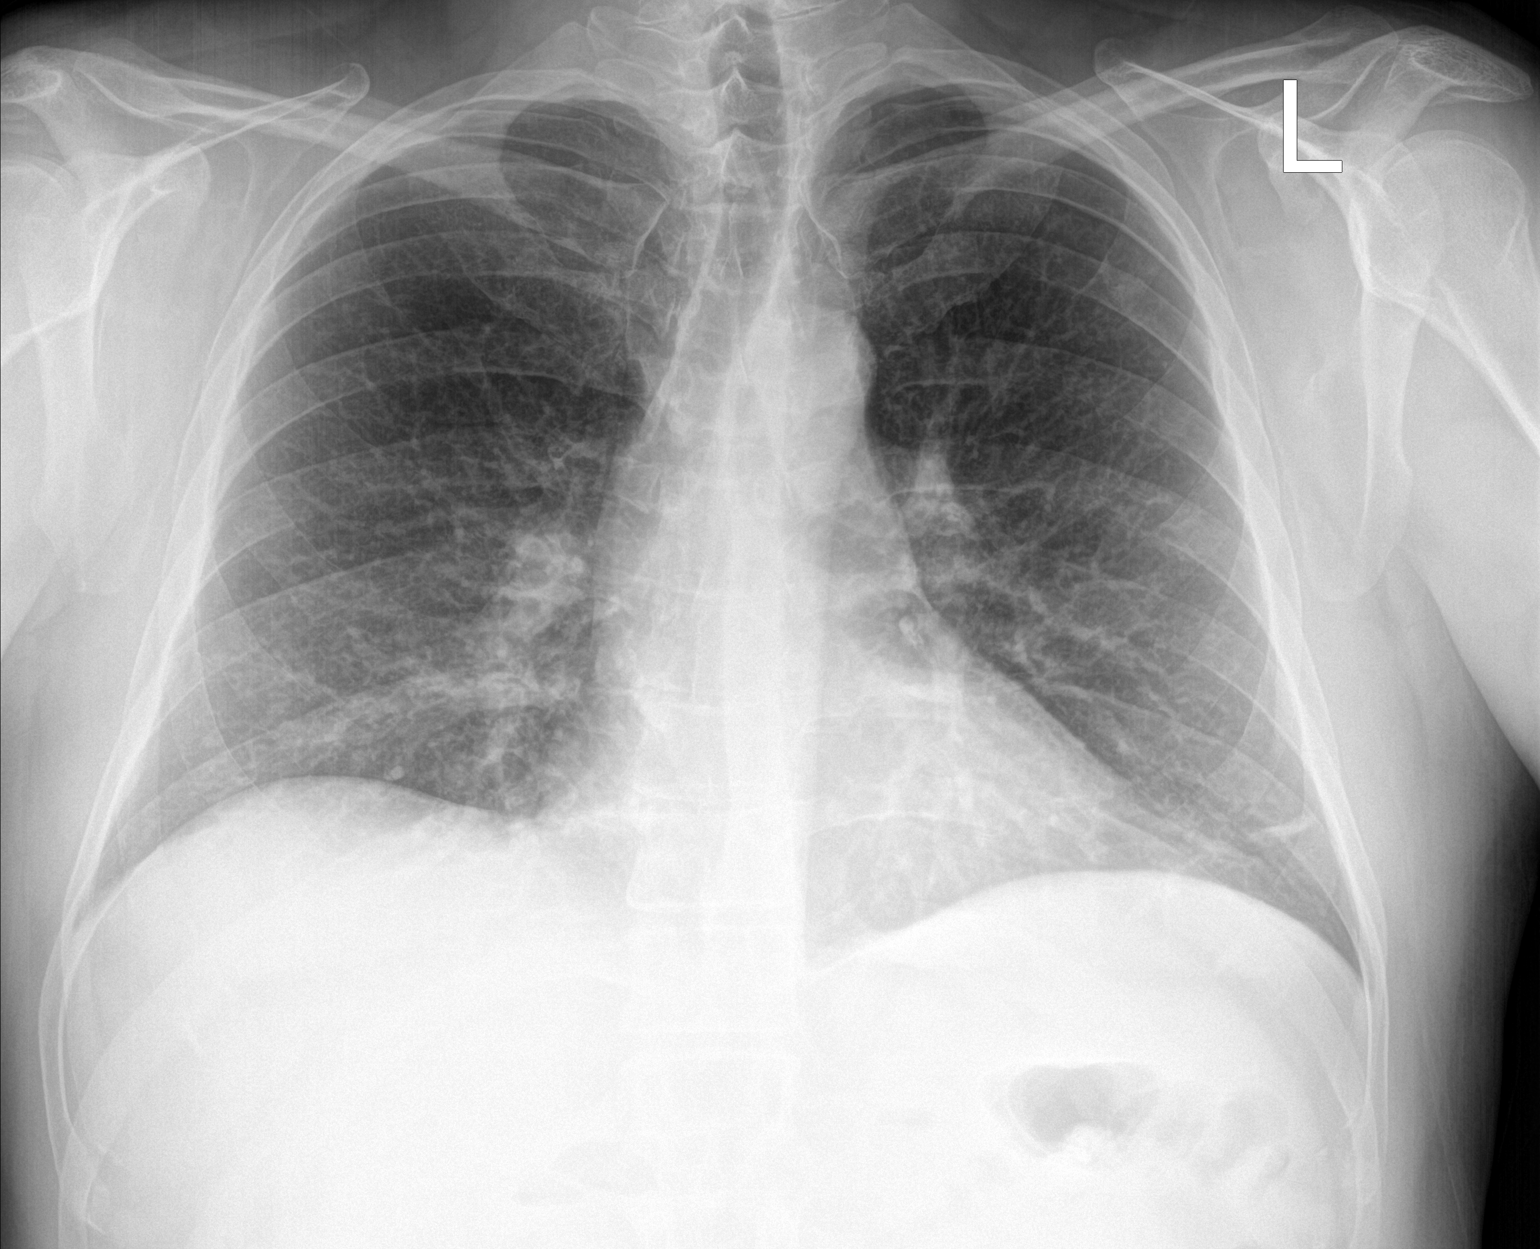

[rib pa]
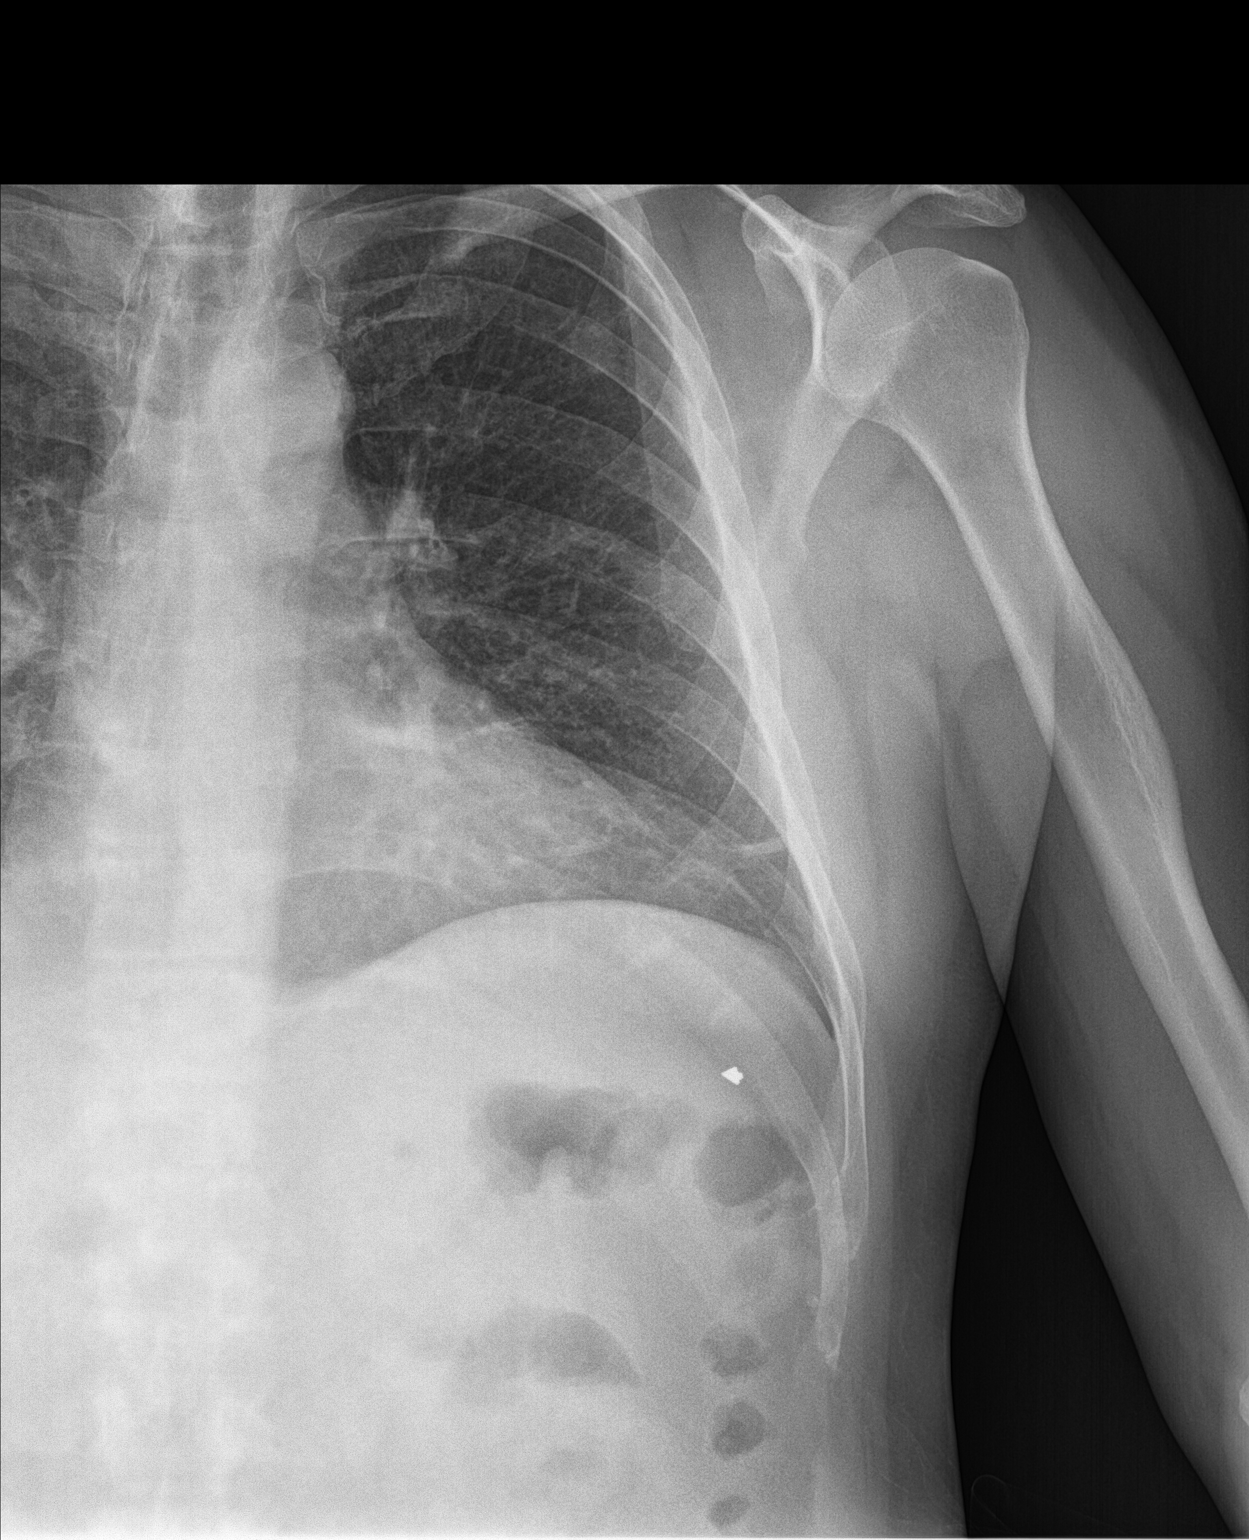

[rib obl]
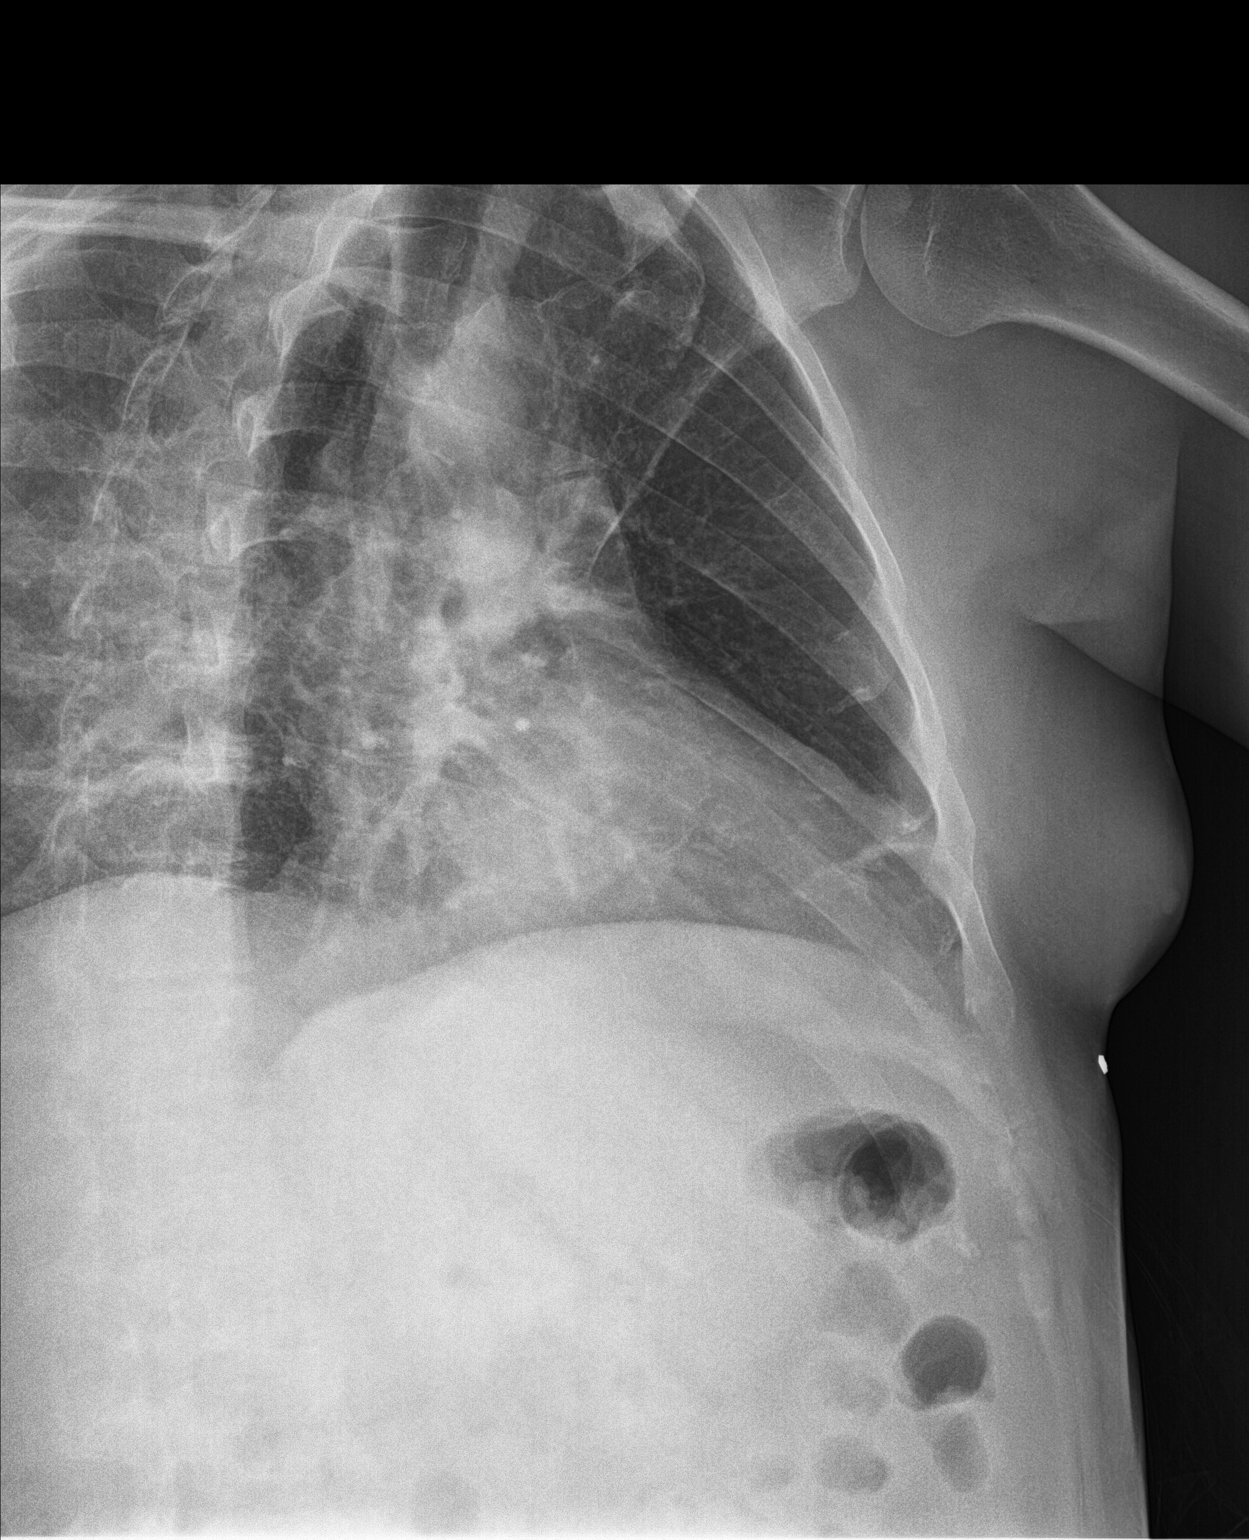

[3 of 3 positions shown; findings below may reference images not displayed]

FINDINGS: Cardiomediastinal silhouette is stable. No infiltrate or pulmonary
edema. No left rib fracture is identified. There is no pneumothorax.
The
IMPRESSION: Negative.

## 2017-11-28 ENCOUNTER — Encounter: Payer: Self-pay | Admitting: Physician Assistant

## 2019-02-14 ENCOUNTER — Other Ambulatory Visit: Payer: Self-pay

## 2019-02-14 ENCOUNTER — Encounter: Payer: Self-pay | Admitting: Family Medicine

## 2019-02-14 ENCOUNTER — Ambulatory Visit: Payer: Self-pay | Admitting: Family Medicine

## 2019-02-14 VITALS — BP 132/80 | HR 84 | Temp 97.9°F | Resp 14 | Ht 67.0 in | Wt 221.6 lb

## 2019-02-14 DIAGNOSIS — Z024 Encounter for examination for driving license: Secondary | ICD-10-CM

## 2019-02-14 NOTE — Patient Instructions (Addendum)
  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 I would recommend decreasing alcohol use - even though that is not used around time of driving.  Take care and stay safe.    If you have lab work done today you will be contacted with your lab results within the next 2 weeks.  If you have not heard from [place] then please contact us. The fastest way to get your results is to register for My Chart.   IF you received an x-ray today, you will receive an invoice from [facility]. Please contact [facility] at [phone] with questions or concerns regarding your invoice.   IF you received labwork today, you will receive an invoice from [place]. Please contact [facility] at [phone] with questions or concerns regarding your invoice.   Our billing staff will not be able to assist you with questions regarding bills from these companies.  You will be contacted with the lab results as soon as they are available. The fastest way to get your results is to activate your My Chart account. Instructions are located on the last page of this paperwork. If you have not heard from [place] regarding the results in 2 weeks, please contact this office.

## 2019-02-14 NOTE — Progress Notes (Signed)
Subjective:    Patient ID: Joshua Leach, male    DOB: [birth year], 43 y.o.   MRN: [id number]  HPI   Joshua Leach is a 43 y.o. male Presents today for: Chief Complaint  Patient presents with  . DOT    Patient is here for his annual dot physical   DOT exam 12/2016 noted tobacco abuse/use at that time with 1 pack/day but was trying to quit.  No other concerning findings on history exam.  2-year card provided  No change in health since last exam. Feels well.  No hx of eye disorders, glaucoma/ahakia or difficulty with glare.  No hx of heart problems, no chest pain with exertion.  Tobacco use - cutting back - less than a pack a day. No cough syncope or dyspnea with exertion.  Alcohol: up to 6 drinks at a time - not every night, cutting back. Not driving now. Not drinking when had to drive and not night before.   No back issues, no msk weakness or deficits.   No known hx of OSA, snoring, or daytime somnolence.    Patient Active Problem List   Diagnosis Date Noted  . Abscess of buttock, right 10/24/2015  . Tobacco abuse 07/05/2014   Past Medical History:  Diagnosis Date  . Cellulitis and abscess of trunk 07/2015   RIGHT buttock   Past Surgical History:  Procedure Laterality Date  . CYST EXCISION Left    LEFT eyebrow   No Known Allergies Prior to Admission medications   Not on File   Social History   Socioeconomic History  . Marital status: Single    Spouse name: n/a  . Number of children: 0  . Years of education: 12th grade  . Highest education level: Not on file  Occupational History  . Occupation: Truck Runner, broadcasting/film/video  . Financial resource strain: Not on file  . Food insecurity    Worry: Not on file    Inability: Not on file  . Transportation needs    Medical: Not on file    Non-medical: Not on file  Tobacco Use  . Smoking status: Current Every Day Smoker    Packs/day: 1.00    Years: 20.00    Pack years: 20.00  . Smokeless tobacco:  Never Used  . Tobacco comment: contemplating, due to cost  Substance and Sexual Activity  . Alcohol use: Yes    Alcohol/week: 48.0 - 60.0 standard drinks    Types: 48 - 60 Standard drinks or equivalent per week    Comment: 1-2 case beer/week  . Drug use: No  . Sexual activity: Yes    Partners: Female    Birth control/protection: Condom  Lifestyle  . Physical activity    Days per week: Not on file    Minutes per session: Not on file  . Stress: Not on file  Relationships  . Social Musician on phone: Not on file    Gets together: Not on file    Attends religious service: Not on file    Active member of club or organization: Not on file    Attends meetings of clubs or organizations: Not on file    Relationship status: Not on file  . Intimate partner violence    Fear of current or ex partner: Not on file    Emotionally abused: Not on file    Physically abused: Not on file    Forced sexual activity: Not on file  Other Topics Concern  . Not on file  Social History Narrative   Lives with 2 roommates.   Father lives close by.    Review of Systems As above, driver form reviewed - no concerns.     Objective:   Physical Exam Vitals signs reviewed.  Constitutional:      Appearance: He is well-developed.  HENT:     Head: Normocephalic and atraumatic.     Right Ear: External ear normal.     Left Ear: External ear normal.  Eyes:     Conjunctiva/sclera: Conjunctivae normal.     Pupils: Pupils are equal, round, and reactive to light.  Neck:     Musculoskeletal: Normal range of motion and neck supple.     Thyroid: No thyromegaly.  Cardiovascular:     Rate and Rhythm: Normal rate and regular rhythm.     Heart sounds: Normal heart sounds.  Pulmonary:     Effort: Pulmonary effort is normal. No respiratory distress.     Breath sounds: Normal breath sounds. No wheezing.  Abdominal:     General: There is no distension.     Palpations: Abdomen is soft.     Tenderness:  There is no abdominal tenderness.     Hernia: There is no hernia in the left inguinal area.  Musculoskeletal: Normal range of motion.        General: No tenderness.  Lymphadenopathy:     Cervical: No cervical adenopathy.  Skin:    General: Skin is warm and dry.  Neurological:     Mental Status: He is alert and oriented to person, place, and time.     Deep Tendon Reflexes: Reflexes are normal and symmetric.  Psychiatric:        Behavior: Behavior normal.    Vitals:   02/14/19 1101  BP: 132/80  Pulse: 84  Resp: 14  Temp: 97.9 F (36.6 C)  TempSrc: Oral  SpO2: 97%  Weight: 221 lb 9.6 oz (100.5 kg)  Height: 5\' 7"  (1.702 m)          Assessment & Plan:  Joshua Leach is a 43 y.o. male Encounter for commercial driver medical examination (CDME) - Plan:  -DOT paperwork reviewed.  No concerning findings on exam or history.  Did recommend cutting back on alcohol even though that has not been using around the time of driving or night before.  Follow-up with primary care provider for routine physical/health care.  2-year card provided, see paperwork.   No orders of the defined types were placed in this encounter.  Patient Instructions    2 year card for DOT. I would recommend decreasing alcohol use - even though that is not used around time of driving.  Take care and stay safe.    If you have lab work done today you will be contacted with your lab results within the next 2 weeks.  If you have not heard from [place] then please contact us. The fastest way to get your results is to register for My Chart.   IF you received an x-ray today, you will receive an invoice from [facility]. Please contact [facility] at [phone] with questions or concerns regarding your invoice.   IF you received labwork today, you will receive an invoice from [place]. Please contact [facility] at [phone] with questions or concerns regarding your invoice.   Our billing staff  will not be able to assist you with questions regarding bills from these companies.  You will be  contacted with the lab results as soon as they are available. The fastest way to get your results is to activate your My Chart account. Instructions are located on the last page of this paperwork. If you have not heard from [place] regarding the results in 2 weeks, please contact this office.       Signed,   Meredith Staggers, MD Primary Care at [facility].  02/14/19 11:48 AM

## 2019-02-15 ENCOUNTER — Encounter: Payer: Self-pay | Admitting: Family Medicine

## 2019-06-12 ENCOUNTER — Other Ambulatory Visit: Payer: Self-pay

## 2019-06-12 DIAGNOSIS — Z20822 Contact with and (suspected) exposure to covid-19: Secondary | ICD-10-CM

## 2019-06-14 LAB — NOVEL CORONAVIRUS, NAA: SARS-CoV-2, NAA: NOT DETECTED

## 2020-01-23 ENCOUNTER — Ambulatory Visit: Payer: Self-pay | Attending: Internal Medicine

## 2020-01-23 DIAGNOSIS — Z23 Encounter for immunization: Secondary | ICD-10-CM

## 2020-01-23 NOTE — Progress Notes (Signed)
   Covid-19 Vaccination Clinic  Name:  Joshua Leach    MRN: [id number] DOB: [birth year]  01/23/2020  Mr. Joshua Leach was observed post Covid-19 immunization for 15 minutes without incident. He was provided with Vaccine Information Sheet and instruction to access the V-Safe system.   Mr. Joshua Leach was instructed to call 911 with any severe reactions post vaccine: Marland Kitchen Difficulty breathing  . Swelling of face and throat  . A fast heartbeat  . A bad rash all over body  . Dizziness and weakness   Immunizations Administered    Name Date Dose VIS Date Route   JANSSEN COVID-19 VACCINE 01/23/2020  8:20 AM 0.5 mL 10/27/2019 Intramuscular   Manufacturer: [employer]   Lot: [id number]   NDC: 86854-883-01

## 2020-08-12 ENCOUNTER — Other Ambulatory Visit: Payer: Self-pay

## 2020-08-12 ENCOUNTER — Telehealth (INDEPENDENT_AMBULATORY_CARE_PROVIDER_SITE_OTHER): Payer: 59 | Admitting: Registered Nurse

## 2020-08-12 DIAGNOSIS — R059 Cough, unspecified: Secondary | ICD-10-CM | POA: Diagnosis not present

## 2020-08-12 DIAGNOSIS — R0989 Other specified symptoms and signs involving the circulatory and respiratory systems: Secondary | ICD-10-CM

## 2020-08-12 MED ORDER — AZITHROMYCIN 250 MG PO TABS: ORAL_TABLET | ORAL | 0 refills | Status: DC

## 2020-08-12 MED ORDER — BENZONATATE 200 MG PO CAPS: 200.0000 mg | ORAL_CAPSULE | Freq: Two times a day (BID) | ORAL | 0 refills | Status: DC | PRN

## 2020-09-04 ENCOUNTER — Encounter: Payer: Self-pay | Admitting: Family Medicine

## 2020-09-04 ENCOUNTER — Telehealth (INDEPENDENT_AMBULATORY_CARE_PROVIDER_SITE_OTHER): Payer: 59 | Admitting: Family Medicine

## 2020-09-04 DIAGNOSIS — K219 Gastro-esophageal reflux disease without esophagitis: Secondary | ICD-10-CM | POA: Diagnosis not present

## 2020-09-04 DIAGNOSIS — R0989 Other specified symptoms and signs involving the circulatory and respiratory systems: Secondary | ICD-10-CM

## 2020-09-04 DIAGNOSIS — R059 Cough, unspecified: Secondary | ICD-10-CM

## 2020-09-04 MED ORDER — PANTOPRAZOLE SODIUM 40 MG PO TBEC: 40.0000 mg | DELAYED_RELEASE_TABLET | Freq: Every day | ORAL | 1 refills | Status: DC

## 2020-09-04 MED ORDER — BENZONATATE 200 MG PO CAPS
200.0000 mg | ORAL_CAPSULE | Freq: Two times a day (BID) | ORAL | 0 refills | Status: DC | PRN
Start: 2020-09-04 — End: 2020-10-02

## 2020-09-04 MED ORDER — PREDNISONE 20 MG PO TABS: ORAL_TABLET | ORAL | 0 refills | Status: AC

## 2020-09-04 MED ORDER — CETIRIZINE HCL 10 MG PO TABS: 10.0000 mg | ORAL_TABLET | Freq: Every day | ORAL | 3 refills | Status: DC

## 2020-09-04 NOTE — Progress Notes (Signed)
Virtual Visit Note  I connected with patient on 09/04/20 at 1625 by Epic video visit and verified that I am speaking with the correct person using two identifiers. Joshua Leach is currently located at home and no family members are currently with them during visit. The provider, Azalee Course Jahari Wiginton, FNP is located in their office at time of visit.  I discussed the limitations, risks, security and privacy concerns of performing an evaluation and management service by telephone and the availability of in person appointments. I also discussed with the patient that there may be a patient responsible charge related to this service. The patient expressed understanding and agreed to proceed.   I provided 20 minutes of non-face-to-face time during this encounter.  Chief Complaint  Patient presents with  . Cough    Chest congestion going on and off 08/12/20- 2 people in home are covid positive  Negative Tuesday and Wednesday - patient is susceptible to pneumonia     HPI ? Chest congestion and white phlegm Talked to someone Dec 14th Cough never went away 2 of roomates tested positive for covid Covid negative Tuesday and Wednesday Gave him Azithromycin and tessalon pearls Cough never really went away Worst first thing in the morning   No Known Allergies  Prior to Admission medications   Medication Sig Start Date End Date Taking? Authorizing Provider  azithromycin (ZITHROMAX) 250 MG tablet Take 2 tabs on first day. Then take 1 tab daily. Finish entire supply. Patient not taking: Reported on 09/04/2020 08/12/20   Janeece Agee, NP  benzonatate (TESSALON) 200 MG capsule Take 1 capsule (200 mg total) by mouth 2 (two) times daily as needed for cough. Patient not taking: Reported on 09/04/2020 08/12/20   Janeece Agee, NP    Past Medical History:  Diagnosis Date  . Cellulitis and abscess of trunk 07/2015   RIGHT buttock  . Hx of recurrent pneumonia     Past Surgical History:   Procedure Laterality Date  . CYST EXCISION Left    LEFT eyebrow    Social History   Tobacco Use  . Smoking status: Current Every Day Smoker    Packs/day: 1.00    Years: 20.00    Pack years: 20.00  . Smokeless tobacco: Never Used  . Tobacco comment: contemplating, due to cost  Substance Use Topics  . Alcohol use: Yes    Alcohol/week: 48.0 - 60.0 standard drinks    Types: 48 - 60 Standard drinks or equivalent per week    Comment: 1-2 case beer/week    History reviewed. No pertinent family history.  Review of Systems  Constitutional: Negative for chills, fever, malaise/fatigue and weight loss.  HENT: Negative for congestion, ear discharge, ear pain, sinus pain and sore throat.   Respiratory: Positive for cough and sputum production. Negative for shortness of breath and wheezing.   Cardiovascular: Negative for chest pain and palpitations.  Gastrointestinal: Positive for heartburn. Negative for abdominal pain, constipation, diarrhea, nausea and vomiting.  Musculoskeletal: Negative for myalgias.  Neurological: Negative for headaches.    Objective  Constitutional:      General: Not in acute distress.    Appearance: Normal appearance. Not ill-appearing.   Pulmonary:     Effort: Pulmonary effort is normal. No respiratory distress.  Neurological:     Mental Status: Alert and oriented to person, place, and time.  Psychiatric:        Mood and Affect: Mood normal.        Behavior: Behavior normal.  ASSESSMENT and PLAN  Problem List Items Addressed This Visit   None   Visit Diagnoses    Gastroesophageal reflux disease without esophagitis    -  Primary   Relevant Medications   pantoprazole (PROTONIX) 40 MG tablet   Cough       Relevant Medications   cetirizine (ZYRTEC) 10 MG tablet   benzonatate (TESSALON) 200 MG capsule   predniSONE (DELTASONE) 20 MG tablet   Chest congestion         RTC/ ED precautions provided If symptoms continue would need to be seen in  person for CXR and labs   Return in about 4 weeks (around 10/02/2020), or if symptoms worsen or fail to improve.    The above assessment and management plan was discussed with the patient. The patient verbalized understanding of and has agreed to the management plan. Patient is aware to call the clinic if symptoms persist or worsen. Patient is aware when to return to the clinic for a follow-up visit. Patient educated on when it is appropriate to go to the emergency department.     Macario Carls Balthazar Dooly, FNP-BC Primary Care at [facility] [address] [place], [place] [address] Ph.  [phone] Fax [phone]

## 2020-09-04 NOTE — Patient Instructions (Addendum)
° °Cough, Adult °A cough helps to clear your throat and lungs. A cough may be a sign of an illness or another medical condition. °An acute cough may only last 2-3 weeks, while a chronic cough may last 8 or more weeks. °Many things can cause a cough. They include: °· Germs (viruses or bacteria) that attack the airway. °· Breathing in things that bother (irritate) your lungs. °· Allergies. °· Asthma. °· Mucus that runs down the back of your throat (postnasal drip). °· Smoking. °· Acid backing up from the stomach into the tube that moves food from the mouth to the stomach (gastroesophageal reflux). °· Some medicines. °· Lung problems. °· Other medical conditions, such as heart failure or a blood clot in the lung (pulmonary embolism). °Follow these instructions at home: °Medicines °· Take over-the-counter and prescription medicines only as told by your doctor. °· Talk with your doctor before you take medicines that stop a cough (coughsuppressants). °Lifestyle ° °· Do not smoke, and try not to be around smoke. Do not use any products that contain nicotine or tobacco, such as cigarettes, e-cigarettes, and chewing tobacco. If you need help quitting, ask your doctor. °· Drink enough fluid to keep your pee (urine) pale yellow. °· Avoid caffeine. °· Do not drink alcohol if your doctor tells you not to drink. °General instructions ° °· Watch for any changes in your cough. Tell your doctor about them. °· Always cover your mouth when you cough. °· Stay away from things that make you cough, such as perfume, candles, campfire smoke, or cleaning products. °· If the air is dry, use a cool mist vaporizer or humidifier in your home. °· If your cough is worse at night, try using extra pillows to raise your head up higher while you sleep. °· Rest as needed. °· Keep all follow-up visits as told by your doctor. This is important. °Contact a doctor if: °· You have new symptoms. °· You cough up pus. °· Your cough does not get better after  2-3 weeks, or your cough gets worse. °· Cough medicine does not help your cough and you are not sleeping well. °· You have pain that gets worse or pain that is not helped with medicine. °· You have a fever. °· You are losing weight and you do not know why. °· You have night sweats. °Get help right away if: °· You cough up blood. °· You have trouble breathing. °· Your heartbeat is very fast. °These symptoms may be an emergency. Do not wait to see if the symptoms will go away. Get medical help right away. Call your local emergency services (911 in the [place] Do not drive yourself to the hospital. °Summary °· A cough helps to clear your throat and lungs. Many things can cause a cough. °· Take over-the-counter and prescription medicines only as told by your doctor. °· Always cover your mouth when you cough. °· Contact a doctor if you have new symptoms or you have a cough that does not get better or gets worse. °This information is not intended to replace advice given to you by your health care provider. Make sure you discuss any questions you have with your health care provider. °Document Revised: 09/04/2018 Document Reviewed: 09/04/2018 °Elsevier Patient Education © 2020 [employer] Inc. ° ° ° °If you have lab work done today you will be contacted with your lab results within the next 2 weeks.  If you have not heard from us then please contact us. The   fastest way to get your results is to register for My Chart. ° ° °IF you received an x-ray today, you will receive an invoice from [facility]. Please contact [facility] at [phone] with questions or concerns regarding your invoice.  ° °IF you received labwork today, you will receive an invoice from [facility]. Please contact [facility] at [phone] with questions or concerns regarding your invoice.  ° °Our billing staff will not be able to assist you with questions regarding bills from these companies. ° °You will be contacted with the lab results as  soon as they are available. The fastest way to get your results is to activate your My Chart account. Instructions are located on the last page of this paperwork. If you have not heard from us regarding the results in 2 weeks, please contact this office. °  ° ° ° °

## 2020-10-02 ENCOUNTER — Encounter: Payer: Self-pay | Admitting: Family Medicine

## 2020-10-02 ENCOUNTER — Ambulatory Visit (INDEPENDENT_AMBULATORY_CARE_PROVIDER_SITE_OTHER): Payer: 59 | Admitting: Family Medicine

## 2020-10-02 ENCOUNTER — Other Ambulatory Visit: Payer: Self-pay | Admitting: Family Medicine

## 2020-10-02 ENCOUNTER — Other Ambulatory Visit: Payer: Self-pay

## 2020-10-02 VITALS — BP 152/96 | HR 73 | Temp 98.7°F | Ht 67.0 in | Wt 226.0 lb

## 2020-10-02 DIAGNOSIS — Z23 Encounter for immunization: Secondary | ICD-10-CM

## 2020-10-02 DIAGNOSIS — T7840XD Allergy, unspecified, subsequent encounter: Secondary | ICD-10-CM | POA: Diagnosis not present

## 2020-10-02 DIAGNOSIS — Z114 Encounter for screening for human immunodeficiency virus [HIV]: Secondary | ICD-10-CM

## 2020-10-02 DIAGNOSIS — Z1321 Encounter for screening for nutritional disorder: Secondary | ICD-10-CM

## 2020-10-02 DIAGNOSIS — Z13220 Encounter for screening for lipoid disorders: Secondary | ICD-10-CM

## 2020-10-02 DIAGNOSIS — R059 Cough, unspecified: Secondary | ICD-10-CM

## 2020-10-02 DIAGNOSIS — Z1329 Encounter for screening for other suspected endocrine disorder: Secondary | ICD-10-CM | POA: Diagnosis not present

## 2020-10-02 DIAGNOSIS — Z1159 Encounter for screening for other viral diseases: Secondary | ICD-10-CM

## 2020-10-02 DIAGNOSIS — Z1211 Encounter for screening for malignant neoplasm of colon: Secondary | ICD-10-CM

## 2020-10-02 DIAGNOSIS — Z131 Encounter for screening for diabetes mellitus: Secondary | ICD-10-CM | POA: Diagnosis not present

## 2020-10-02 MED ORDER — FLUTICASONE PROPIONATE 50 MCG/ACT NA SUSP: 2.0000 | Freq: Every day | NASAL | 6 refills | Status: AC

## 2020-10-02 MED ORDER — CETIRIZINE HCL 10 MG PO TABS
10.0000 mg | ORAL_TABLET | Freq: Every day | ORAL | 3 refills | Status: AC
Start: 2020-10-02 — End: ?

## 2020-10-02 MED ORDER — ALBUTEROL SULFATE HFA 108 (90 BASE) MCG/ACT IN AERS: 2.0000 | INHALATION_SPRAY | Freq: Four times a day (QID) | RESPIRATORY_TRACT | 2 refills | Status: DC | PRN

## 2020-10-02 MED ORDER — BENZONATATE 200 MG PO CAPS
200.0000 mg | ORAL_CAPSULE | Freq: Two times a day (BID) | ORAL | 1 refills | Status: AC | PRN
Start: 2020-10-02 — End: ?

## 2020-10-02 NOTE — Patient Instructions (Addendum)
 Cough, Adult A cough helps to clear your throat and lungs. A cough may be a sign of an illness or another medical condition. An acute cough may only last 2-3 weeks, while a chronic cough may last 8 or more weeks. Many things can cause a cough. They include:  Germs (viruses or bacteria) that attack the airway.  Breathing in things that bother (irritate) your lungs.  Allergies.  Asthma.  Mucus that runs down the back of your throat (postnasal drip).  Smoking.  Acid backing up from the stomach into the tube that moves food from the mouth to the stomach (gastroesophageal reflux).  Some medicines.  Lung problems.  Other medical conditions, such as heart failure or a blood clot in the lung (pulmonary embolism). Follow these instructions at home: Medicines  Take over-the-counter and prescription medicines only as told by your doctor.  Talk with your doctor before you take medicines that stop a cough (cough suppressants). Lifestyle  Do not smoke, and try not to be around smoke. Do not use any products that contain nicotine or tobacco, such as cigarettes, e-cigarettes, and chewing tobacco. If you need help quitting, ask your doctor.  Drink enough fluid to keep your pee (urine) pale yellow.  Avoid caffeine.  Do not drink alcohol if your doctor tells you not to drink.   General instructions  Watch for any changes in your cough. Tell your doctor about them.  Always cover your mouth when you cough.  Stay away from things that make you cough, such as perfume, candles, campfire smoke, or cleaning products.  If the air is dry, use a cool mist vaporizer or humidifier in your home.  If your cough is worse at night, try using extra pillows to raise your head up higher while you sleep.  Rest as needed.  Keep all follow-up visits as told by your doctor. This is important.   Contact a doctor if:  You have new symptoms.  You cough up pus.  Your cough does not get better after  2-3 weeks, or your cough gets worse.  Cough medicine does not help your cough and you are not sleeping well.  You have pain that gets worse or pain that is not helped with medicine.  You have a fever.  You are losing weight and you do not know why.  You have night sweats. Get help right away if:  You cough up blood.  You have trouble breathing.  Your heartbeat is very fast. These symptoms may be an emergency. Do not wait to see if the symptoms will go away. Get medical help right away. Call your local emergency services (911 in the [place] Do not drive yourself to the hospital. Summary  A cough helps to clear your throat and lungs. Many things can cause a cough.  Take over-the-counter and prescription medicines only as told by your doctor.  Always cover your mouth when you cough.  Contact a doctor if you have new symptoms or you have a cough that does not get better or gets worse. This information is not intended to replace advice given to you by your health care provider. Make sure you discuss any questions you have with your health care provider. Document Revised: 10/05/2019 Document Reviewed: 09/04/2018 Elsevier Patient Education  2021 Elsevier Inc.    If you have lab work done today you will be contacted with your lab results within the next 2 weeks.  If you have not heard from us then please   contact us. The fastest way to get your results is to register for My Chart.   IF you received an x-ray today, you will receive an invoice from [facility]. Please contact [facility] at [phone] with questions or concerns regarding your invoice.   IF you received labwork today, you will receive an invoice from [facility]. Please contact [facility] at [phone] with questions or concerns regarding your invoice.   Our billing staff will not be able to assist you with questions regarding bills from these companies.  You will be contacted with the lab results as  soon as they are available. The fastest way to get your results is to activate your My Chart account. Instructions are located on the last page of this paperwork. If you have not heard from us regarding the results in 2 weeks, please contact this office.      

## 2020-10-02 NOTE — Telephone Encounter (Signed)
   Notes to clinic:  Alternative Requested:INS COVERS PROAIR HFA.   Requested Prescriptions  Pending Prescriptions Disp Refills   PROAIR HFA 108 (90 Base) MCG/ACT inhaler [Pharmacy Med Name: PROAIR HFA 90 MCG INHALER] 8.5 each 0      Pulmonology:  Beta Agonists Failed - 10/02/2020  2:26 PM      Failed - One inhaler should last at least one month. If the patient is requesting refills earlier, contact the patient to check for uncontrolled symptoms.      Passed - Valid encounter within last 12 months    Recent Outpatient Visits           Today Allergy, subsequent encounter   [facility] at [place] [facility], Azalee Course, FNP   4 weeks ago Gastroesophageal reflux disease without esophagitis   [facility] at [place] [facility], Azalee Course, FNP   1 month ago Cough   [facility], Gerlene Burdock, NP   1 year ago Encounter for commercial driver medical examination (CDME)   [facility], Asencion Partridge, MD   3 years ago Acute upper respiratory infection   [facility] at [place], [place], [place]       Future Appointments             In 4 weeks Just, Azalee Course, FNP [facility] at [place], [facility]

## 2020-10-02 NOTE — Progress Notes (Signed)
2/3/20221:39 PM  Joshua Leach 1975-09-04, 45 y.o., male [id number]  Chief Complaint  Patient presents with  . Cough    Follow up , still going on - productive w chest congestion - completed medication - last negative covid test 09/18/20    HPI:   Patient is a 45 y.o. male with past medical history significant for smoking who presents today for cough follow up.  Cough started Thanksgiving Roommate, sister and nephew all tested positive for covid He never tested positive Seen by Luan Pulling 12/14 antibiotics, (helped some) Last OV 1/6 cough: allergies vs Genella Rife  started protonix Started Cetrizine at night (Prednisone and tessalon)  Still have the cough no other symptoms Cough is the worst in the morning Smoke for 30 plus years The more we discussed he stated the cough really started after he was diagnosed with pneumonia in 2017  He states was on a inhaler in the past, unsure which ones but one was disc shaped (albuterol and advair?) Unsure dose or what his diagnosis was for being on the inhaler Has been a long time since he has used inhaler (10 years) Seen Dr. Michele Mcalpine in the past: [address]   Depression screen [facility] 2/9 10/02/2020 09/04/2020 02/14/2019  Decreased Interest 0 0 0  Down, Depressed, Hopeless 0 0 0  PHQ - 2 Score 0 0 0    Fall Risk  10/02/2020 09/04/2020 02/14/2019 08/17/2017 06/06/2017  Falls in the past year? 0 0 0 No No  Number falls in past yr: 0 0 0 - -  Injury with Fall? 0 0 0 - -  Follow up Falls evaluation completed Falls evaluation completed Falls evaluation completed - -     No Known Allergies  Prior to Admission medications   Medication Sig Start Date End Date Taking? Authorizing Provider  azithromycin (ZITHROMAX) 250 MG tablet Take 2 tabs on first day. Then take 1 tab daily. Finish entire supply. Patient not taking: Reported on 09/04/2020 08/12/20   Janeece Agee, NP  benzonatate (TESSALON) 200 MG capsule Take 1 capsule (200 mg total) by mouth 2  (two) times daily as needed for cough. 09/04/20   [place] Basham, Azalee Course, FNP  cetirizine (ZYRTEC) 10 MG tablet Take 1 tablet (10 mg total) by mouth at bedtime. 09/04/20   [place]st Pruden, Azalee Course, FNP  pantoprazole (PROTONIX) 40 MG tablet Take 1 tablet (40 mg total) by mouth daily for 14 days. 09/04/20 09/18/20  Jabez Molner, Azalee Course, FNP    Past Medical History:  Diagnosis Date  . Cellulitis and abscess of trunk 07/2015   RIGHT buttock  . Hx of recurrent pneumonia     Past Surgical History:  Procedure Laterality Date  . CYST EXCISION Left    LEFT eyebrow    Social History   Tobacco Use  . Smoking status: Current Every Day Smoker    Packs/day: 1.00    Years: 20.00    Pack years: 20.00  . Smokeless tobacco: Never Used  . Tobacco comment: contemplating, due to cost  Substance Use Topics  . Alcohol use: Yes    Alcohol/week: 48.0 - 60.0 standard drinks    Types: 48 - 60 Standard drinks or equivalent per week    Comment: 1-2 case beer/week    No family history on file.  Review of Systems  Constitutional: Negative for chills, fever and malaise/fatigue.  Respiratory: Positive for cough and sputum production. Negative for shortness of breath and wheezing.   Cardiovascular: Negative for chest pain, palpitations and  leg swelling.  Gastrointestinal: Negative for abdominal pain, constipation, diarrhea, heartburn, nausea and vomiting.  Musculoskeletal: Negative for back pain and joint pain.  Skin: Negative for rash.  Neurological: Negative for dizziness, weakness and headaches.     OBJECTIVE:  Today's Vitals   10/02/20 0821  BP: (!) 152/96  Pulse: 73  Temp: 98.7 F (37.1 C)  SpO2: 99%  Weight: 226 lb (102.5 kg)  Height: 5\' 7"  (1.702 m)   Body mass index is 35.4 kg/m.   Physical Exam Vitals reviewed.  Constitutional:      Appearance: Normal appearance.  HENT:     Head: Normocephalic and atraumatic.  Eyes:     Conjunctiva/sclera: Conjunctivae normal.     Pupils: Pupils are equal, round, and  reactive to light.  Cardiovascular:     Rate and Rhythm: Normal rate and regular rhythm.     Pulses: Normal pulses.     Heart sounds: Normal heart sounds. No murmur heard. No friction rub. No gallop.   Pulmonary:     Effort: Pulmonary effort is normal. No respiratory distress.     Breath sounds: Normal breath sounds. No stridor. No wheezing or rales.  Abdominal:     General: Bowel sounds are normal.     Palpations: Abdomen is soft.     Tenderness: There is no abdominal tenderness.  Musculoskeletal:     Right lower leg: No edema.     Left lower leg: No edema.  Skin:    General: Skin is warm and dry.  Neurological:     General: No focal deficit present.     Mental Status: He is alert and oriented to person, place, and time.  Psychiatric:        Mood and Affect: Mood normal.        Behavior: Behavior normal.     No results found for this or any previous visit (from the past 24 hour(s)).  No results found.   ASSESSMENT and PLAN  Problem List Items Addressed This Visit   None   Visit Diagnoses    Allergy, subsequent encounter    -  Primary   Relevant Medications   cetirizine (ZYRTEC) 10 MG tablet   fluticasone (FLONASE) 50 MCG/ACT nasal spray   Cough       Relevant Medications   benzonatate (TESSALON) 200 MG capsule   albuterol (VENTOLIN HFA) 108 (90 Base) MCG/ACT inhaler   Other Relevant Orders   CBC with Differential   Screening for diabetes mellitus       Relevant Orders   CMP14+EGFR   Hemoglobin A1c   Screening for thyroid disorder       Relevant Orders   TSH   Encounter for vitamin deficiency screening       Relevant Orders   Vitamin D, 25-hydroxy   Screening, lipid       Relevant Orders   Lipid Panel   Screening for HIV (human immunodeficiency virus)       Relevant Orders   HIV Antibody (routine testing w rflx)   Encounter for hepatitis C screening test for low risk patient       Relevant Orders   Hepatitis C antibody   Colon cancer screening        Relevant Orders   Cologuard   Encounter for vaccination       Relevant Orders   Tdap vaccine greater than or equal to 7yo IM (Completed)      Plan . Will follow up with lab results .  Tdap given this visit . Cologuard ordered . Discussed using albuterol PRN, Will follow up in 1 week with use (if having increased use will add daily inhaler, per description may have been on advair in the past) . Discussed possible need to work up for further pulmonary disease given uncertain history and long hx of smoking . Restart nightly cetrizine and daily flonase   Return in about 4 weeks (around 10/30/2020).   Joshua Carls Marcha Licklider, FNP-BC Primary Care at [facility] [address] [place], [place] [address] Ph.  [phone] Fax [phone]

## 2020-10-03 ENCOUNTER — Other Ambulatory Visit: Payer: Self-pay | Admitting: Family Medicine

## 2020-10-03 DIAGNOSIS — E782 Mixed hyperlipidemia: Secondary | ICD-10-CM

## 2020-10-03 DIAGNOSIS — E559 Vitamin D deficiency, unspecified: Secondary | ICD-10-CM

## 2020-10-03 LAB — HEPATITIS C ANTIBODY: Hep C Virus Ab: <0.1 s/co ratio (ref 0.0–0.9)

## 2020-10-03 LAB — CBC WITH DIFFERENTIAL/PLATELET
Basophils Absolute: 0.1 10*3/uL (ref 0.0–0.2)
Basos: 1 %
EOS (ABSOLUTE): 0.2 10*3/uL (ref 0.0–0.4)
Eos: 2 %
Hematocrit: 46.1 % (ref 37.5–51.0)
Hemoglobin: 16.3 g/dL (ref 13.0–17.7)
Immature Grans (Abs): 0 10*3/uL (ref 0.0–0.1)
Immature Granulocytes: 0 %
Lymphocytes Absolute: 2.2 10*3/uL (ref 0.7–3.1)
Lymphs: 21 %
MCH: 33.1 pg — ABNORMAL HIGH (ref 26.6–33.0)
MCHC: 35.4 g/dL (ref 31.5–35.7)
MCV: 94 fL (ref 79–97)
Monocytes Absolute: 0.6 10*3/uL (ref 0.1–0.9)
Monocytes: 6 %
Neutrophils Absolute: 7.3 10*3/uL — ABNORMAL HIGH (ref 1.4–7.0)
Neutrophils: 70 %
Platelets: 283 10*3/uL (ref 150–450)
RBC: 4.93 x10E6/uL (ref 4.14–5.80)
RDW: 12.3 % (ref 11.6–15.4)
WBC: 10.5 10*3/uL (ref 3.4–10.8)

## 2020-10-03 LAB — CMP14+EGFR
ALT: 37 IU/L (ref 0–44)
AST: 22 IU/L (ref 0–40)
Albumin/Globulin Ratio: 1.9 (ref 1.2–2.2)
Albumin: 4.8 g/dL (ref 4.0–5.0)
Alkaline Phosphatase: 119 IU/L (ref 44–121)
BUN/Creatinine Ratio: 7 — ABNORMAL LOW (ref 9–20)
BUN: 6 mg/dL (ref 6–24)
Bilirubin Total: 0.3 mg/dL (ref 0.0–1.2)
CO2: 22 mmol/L (ref 20–29)
Calcium: 9.6 mg/dL (ref 8.7–10.2)
Chloride: 100 mmol/L (ref 96–106)
Creatinine, Ser: 0.81 mg/dL (ref 0.76–1.27)
GFR calc Af Amer: 124 mL/min/{1.73_m2} (ref >59)
GFR calc non Af Amer: 107 mL/min/{1.73_m2} (ref >59)
Globulin, Total: 2.5 g/dL (ref 1.5–4.5)
Glucose: 94 mg/dL (ref 65–99)
Potassium: 4.7 mmol/L (ref 3.5–5.2)
Sodium: 139 mmol/L (ref 134–144)
Total Protein: 7.3 g/dL (ref 6.0–8.5)

## 2020-10-03 LAB — TSH: TSH: 2.08 u[IU]/mL (ref 0.450–4.500)

## 2020-10-03 LAB — HEMOGLOBIN A1C
Est. average glucose Bld gHb Est-mCnc: 108 mg/dL
Hgb A1c MFr Bld: 5.4 % (ref 4.8–5.6)

## 2020-10-03 LAB — LIPID PANEL
Chol/HDL Ratio: 4.7 ratio (ref 0.0–5.0)
Cholesterol, Total: 260 mg/dL — ABNORMAL HIGH (ref 100–199)
HDL: 55 mg/dL (ref >39)
LDL Chol Calc (NIH): 189 mg/dL — ABNORMAL HIGH (ref 0–99)
Triglycerides: 94 mg/dL (ref 0–149)
VLDL Cholesterol Cal: 16 mg/dL (ref 5–40)

## 2020-10-03 LAB — HIV ANTIBODY (ROUTINE TESTING W REFLEX): HIV Screen 4th Generation wRfx: NONREACTIVE

## 2020-10-03 LAB — VITAMIN D 25 HYDROXY (VIT D DEFICIENCY, FRACTURES): Vit D, 25-Hydroxy: 9.7 ng/mL — ABNORMAL LOW (ref 30.0–100.0)

## 2020-10-03 MED ORDER — ROSUVASTATIN CALCIUM 10 MG PO TABS: 10.0000 mg | ORAL_TABLET | Freq: Every day | ORAL | 3 refills | Status: AC

## 2020-10-03 MED ORDER — VITAMIN D (ERGOCALCIFEROL) 1.25 MG (50000 UNIT) PO CAPS: 50000.0000 [IU] | ORAL_CAPSULE | ORAL | 6 refills | Status: AC

## 2020-10-30 ENCOUNTER — Ambulatory Visit: Payer: 59 | Admitting: Family Medicine

## 2020-11-09 NOTE — Progress Notes (Signed)
Telemedicine Encounter- SOAP NOTE Established Patient  This telephone encounter was conducted with the patient's (or proxy's) verbal consent via audio telecommunications: yes  Patient was instructed to have this encounter in a suitably private space; and to only have persons present to whom they give permission to participate. In addition, patient identity was confirmed by use of name plus two identifiers (DOB and address).  I discussed the limitations, risks, security and privacy concerns of performing an evaluation and management service by telephone and the availability of in person appointments. I also discussed with the patient that there may be a patient responsible charge related to this service. The patient expressed understanding and agreed to proceed.  I spent a total of 15 minutes talking with the patient or their proxy.  Patient at home Provider in office  No chief complaint on file.   Subjective   Joshua Leach is a 45 y.o. established patient. Telephone visit today for ongoing productive cough  HPI Present over 1 week. Waxes and wanes. Worse at night Feels feverish No sick contacts Thick mucus comes up Has had lower respiratory infection in the past and this feels similar Concerned give hx of tobacco use No LOC, lightheadedness, dizziness, weight loss, hemoptysis  Patient Active Problem List   Diagnosis Date Noted  . Abscess of buttock, right 10/24/2015  . Tobacco abuse 07/05/2014    Past Medical History:  Diagnosis Date  . Cellulitis and abscess of trunk 07/2015   RIGHT buttock  . Hx of recurrent pneumonia     Current Outpatient Medications  Medication Sig Dispense Refill  . albuterol (PROAIR HFA) 108 (90 Base) MCG/ACT inhaler Inhale 2 puffs into the lungs every 6 (six) hours as needed for wheezing or shortness of breath. 8.5 each 2  . benzonatate (TESSALON) 200 MG capsule Take 1 capsule (200 mg total) by mouth 2 (two) times daily as needed for  cough. 40 capsule 1  . cetirizine (ZYRTEC) 10 MG tablet Take 1 tablet (10 mg total) by mouth at bedtime. 45 tablet 3  . fluticasone (FLONASE) 50 MCG/ACT nasal spray Place 2 sprays into both nostrils daily. 16 g 6  . rosuvastatin (CRESTOR) 10 MG tablet Take 1 tablet (10 mg total) by mouth daily. 90 tablet 3  . Vitamin D, Ergocalciferol, (DRISDOL) 1.25 MG (50000 UNIT) CAPS capsule Take 1 capsule (50,000 Units total) by mouth every 7 (seven) days. 5 capsule 6   No current facility-administered medications for this visit.    No Known Allergies  Social History   Socioeconomic History  . Marital status: Single    Spouse name: n/a  . Number of children: 0  . Years of education: 12th grade  . Highest education level: Not on file  Occupational History  . Occupation: [employer] doctor  Tobacco Use  . Smoking status: Current Every Day Smoker    Packs/day: 1.00    Years: 20.00    Pack years: 20.00  . Smokeless tobacco: Never Used  . Tobacco comment: contemplating, due to cost  Substance and Sexual Activity  . Alcohol use: Yes    Alcohol/week: 48.0 - 60.0 standard drinks    Types: 48 - 60 Standard drinks or equivalent per week    Comment: 1-2 case beer/week  . Drug use: No  . Sexual activity: Yes    Partners: Female    Birth control/protection: Condom  Other Topics Concern  . Not on file  Social History Narrative   Lives with 2 roommates.  Father lives close by.   Social Determinants of Health   Financial Resource Strain: Not on file  Food Insecurity: Not on file  Transportation Needs: Not on file  Physical Activity: Not on file  Stress: Not on file  Social Connections: Not on file  Intimate Partner Violence: Not on file    ROS Per hpi   Objective   Vitals as reported by the patient: There were no vitals filed for this visit.  Diagnoses and all orders for this visit:  Cough -     Discontinue: azithromycin (ZITHROMAX) 250 MG tablet; Take 2 tabs on first day. Then take 1  tab daily. Finish entire supply. (Patient not taking: Reported on 09/04/2020) -     Discontinue: benzonatate (TESSALON) 200 MG capsule; Take 1 capsule (200 mg total) by mouth 2 (two) times daily as needed for cough. (Patient not taking: Reported on 09/04/2020)  Chest congestion -     Discontinue: azithromycin (ZITHROMAX) 250 MG tablet; Take 2 tabs on first day. Then take 1 tab daily. Finish entire supply. (Patient not taking: Reported on 09/04/2020) -     Discontinue: benzonatate (TESSALON) 200 MG capsule; Take 1 capsule (200 mg total) by mouth 2 (two) times daily as needed for cough. (Patient not taking: Reported on 09/04/2020)   PLAN  Suspect bacterial infection  z pack and tessalon  Encourage covid testing  Return precautions and ER precautions given  Patient encouraged to call clinic with any questions, comments, or concerns.   I discussed the assessment and treatment plan with the patient. The patient was provided an opportunity to ask questions and all were answered. The patient agreed with the plan and demonstrated an understanding of the instructions.   The patient was advised to call back or seek an in-person evaluation if the symptoms worsen or if the condition fails to improve as anticipated.  I provided 15 minutes of non-face-to-face time during this encounter.  Janeece Agee, NP  Primary Care at [facility]

## 2021-01-01 ENCOUNTER — Other Ambulatory Visit: Payer: Self-pay | Admitting: Family Medicine

## 2021-01-01 DIAGNOSIS — R059 Cough, unspecified: Secondary | ICD-10-CM
